# Patient Record
Sex: Female | Born: 1935 | Race: White | Hispanic: No | Smoking: Never smoker
Health system: Southern US, Community
[De-identification: ages and names within clinical notes are randomized; demographics above are authoritative.]

## PROBLEM LIST (undated history)

## (undated) DIAGNOSIS — C50919 Malignant neoplasm of unspecified site of unspecified female breast: Secondary | ICD-10-CM

## (undated) DIAGNOSIS — L570 Actinic keratosis: Secondary | ICD-10-CM

## (undated) DIAGNOSIS — H269 Unspecified cataract: Secondary | ICD-10-CM

## (undated) DIAGNOSIS — C3490 Malignant neoplasm of unspecified part of unspecified bronchus or lung: Secondary | ICD-10-CM

## (undated) HISTORY — DX: Actinic keratosis: L57.0

## (undated) HISTORY — PX: ABDOMINAL HYSTERECTOMY: SHX81

## (undated) HISTORY — DX: Malignant neoplasm of unspecified part of unspecified bronchus or lung: C34.90

## (undated) HISTORY — PX: BREAST LUMPECTOMY: SHX2

## (undated) HISTORY — DX: Malignant neoplasm of unspecified site of unspecified female breast: C50.919

## (undated) HISTORY — PX: PARTIAL HYSTERECTOMY: SHX80

## (undated) HISTORY — DX: Unspecified cataract: H26.9

---

## 1981-04-14 DIAGNOSIS — C3490 Malignant neoplasm of unspecified part of unspecified bronchus or lung: Secondary | ICD-10-CM

## 1981-04-14 HISTORY — DX: Malignant neoplasm of unspecified part of unspecified bronchus or lung: C34.90

## 1995-04-15 DIAGNOSIS — C50919 Malignant neoplasm of unspecified site of unspecified female breast: Secondary | ICD-10-CM

## 1995-04-15 HISTORY — DX: Malignant neoplasm of unspecified site of unspecified female breast: C50.919

## 2004-01-31 ENCOUNTER — Ambulatory Visit: Payer: Self-pay | Admitting: Internal Medicine

## 2004-03-06 ENCOUNTER — Ambulatory Visit: Payer: Self-pay | Admitting: Oncology

## 2004-07-27 ENCOUNTER — Inpatient Hospital Stay: Payer: Self-pay | Admitting: Internal Medicine

## 2004-08-26 ENCOUNTER — Emergency Department: Payer: Self-pay | Admitting: Emergency Medicine

## 2004-09-02 ENCOUNTER — Ambulatory Visit: Payer: Self-pay | Admitting: Internal Medicine

## 2004-10-22 ENCOUNTER — Ambulatory Visit: Payer: Self-pay | Admitting: Internal Medicine

## 2004-12-05 ENCOUNTER — Ambulatory Visit: Payer: Self-pay | Admitting: Internal Medicine

## 2005-02-03 ENCOUNTER — Ambulatory Visit: Payer: Self-pay | Admitting: Internal Medicine

## 2005-02-18 ENCOUNTER — Ambulatory Visit: Payer: Self-pay | Admitting: Internal Medicine

## 2005-04-16 ENCOUNTER — Ambulatory Visit: Payer: Self-pay | Admitting: Internal Medicine

## 2005-06-16 ENCOUNTER — Ambulatory Visit: Payer: Self-pay | Admitting: Internal Medicine

## 2005-12-17 ENCOUNTER — Ambulatory Visit: Payer: Self-pay | Admitting: Internal Medicine

## 2005-12-29 ENCOUNTER — Ambulatory Visit: Payer: Self-pay | Admitting: Internal Medicine

## 2006-05-08 ENCOUNTER — Ambulatory Visit: Payer: Self-pay | Admitting: Internal Medicine

## 2006-05-11 ENCOUNTER — Ambulatory Visit: Payer: Self-pay | Admitting: Internal Medicine

## 2006-07-13 ENCOUNTER — Ambulatory Visit: Payer: Self-pay | Admitting: Internal Medicine

## 2007-02-10 ENCOUNTER — Ambulatory Visit: Payer: Self-pay | Admitting: Internal Medicine

## 2008-02-11 ENCOUNTER — Ambulatory Visit: Payer: Self-pay | Admitting: Internal Medicine

## 2009-02-15 ENCOUNTER — Ambulatory Visit: Payer: Self-pay | Admitting: Internal Medicine

## 2009-10-03 ENCOUNTER — Ambulatory Visit: Payer: Self-pay | Admitting: Internal Medicine

## 2010-04-30 ENCOUNTER — Ambulatory Visit: Payer: Self-pay | Admitting: Internal Medicine

## 2011-06-04 ENCOUNTER — Ambulatory Visit: Payer: Self-pay | Admitting: Internal Medicine

## 2012-06-07 ENCOUNTER — Ambulatory Visit: Payer: Self-pay | Admitting: Internal Medicine

## 2013-06-13 ENCOUNTER — Inpatient Hospital Stay: Payer: Self-pay | Admitting: Internal Medicine

## 2013-06-13 LAB — CBC WITH DIFFERENTIAL/PLATELET
BASOS ABS: 0.1 10*3/uL (ref 0.0–0.1)
Basophil %: 0.5 %
Eosinophil #: 0 10*3/uL (ref 0.0–0.7)
Eosinophil %: 0.2 %
HCT: 42 % (ref 35.0–47.0)
HGB: 15.5 g/dL (ref 12.0–16.0)
LYMPHS PCT: 15.8 %
Lymphocyte #: 2.5 10*3/uL (ref 1.0–3.6)
MCH: 32.4 pg (ref 26.0–34.0)
MCHC: 36.8 g/dL — AB (ref 32.0–36.0)
MCV: 88 fL (ref 80–100)
MONO ABS: 0.9 x10 3/mm (ref 0.2–0.9)
MONOS PCT: 6 %
NEUTROS PCT: 77.5 %
Neutrophil #: 12.2 10*3/uL — ABNORMAL HIGH (ref 1.4–6.5)
Platelet: 115 10*3/uL — ABNORMAL LOW (ref 150–440)
RBC: 4.78 10*6/uL (ref 3.80–5.20)
RDW: 12.9 % (ref 11.5–14.5)
WBC: 15.7 10*3/uL — ABNORMAL HIGH (ref 3.6–11.0)

## 2013-06-13 LAB — COMPREHENSIVE METABOLIC PANEL
ANION GAP: 10 (ref 7–16)
Albumin: 3.6 g/dL (ref 3.4–5.0)
Alkaline Phosphatase: 57 U/L
BUN: 7 mg/dL (ref 7–18)
Bilirubin,Total: 1.1 mg/dL — ABNORMAL HIGH (ref 0.2–1.0)
CALCIUM: 8.7 mg/dL (ref 8.5–10.1)
CO2: 26 mmol/L (ref 21–32)
Chloride: 76 mmol/L — ABNORMAL LOW (ref 98–107)
Creatinine: 0.65 mg/dL (ref 0.60–1.30)
EGFR (African American): >60
Glucose: 143 mg/dL — ABNORMAL HIGH (ref 65–99)
OSMOLALITY: 228 (ref 275–301)
POTASSIUM: 3.3 mmol/L — AB (ref 3.5–5.1)
SGOT(AST): 32 U/L (ref 15–37)
SGPT (ALT): 19 U/L (ref 12–78)
Sodium: 112 mmol/L — CL (ref 136–145)
Total Protein: 7.3 g/dL (ref 6.4–8.2)

## 2013-06-13 LAB — URINALYSIS, COMPLETE
BACTERIA: NONE SEEN
Bilirubin,UR: NEGATIVE
Blood: NEGATIVE
Glucose,UR: NEGATIVE mg/dL (ref 0–75)
Nitrite: NEGATIVE
PH: 7 (ref 4.5–8.0)
PROTEIN: NEGATIVE
Specific Gravity: 1.016 (ref 1.003–1.030)
Squamous Epithelial: 8
WBC UR: 5 /HPF (ref 0–5)

## 2013-06-13 LAB — OSMOLALITY: Osmolality: 240 mOsm/kg — CL (ref 280–301)

## 2013-06-13 LAB — OSMOLALITY, URINE: Osmolality: 529 mOsm/kg

## 2013-06-13 LAB — RAPID INFLUENZA A&B ANTIGENS

## 2013-06-14 LAB — BASIC METABOLIC PANEL
Anion Gap: 11 (ref 7–16)
Anion Gap: 6 — ABNORMAL LOW (ref 7–16)
Anion Gap: 6 — ABNORMAL LOW (ref 7–16)
BUN: 6 mg/dL — AB (ref 7–18)
BUN: 5 mg/dL — ABNORMAL LOW (ref 7–18)
BUN: 5 mg/dL — ABNORMAL LOW (ref 7–18)
CALCIUM: 8 mg/dL — AB (ref 8.5–10.1)
CHLORIDE: 87 mmol/L — AB (ref 98–107)
CHLORIDE: 94 mmol/L — AB (ref 98–107)
CO2: 26 mmol/L (ref 21–32)
CO2: 27 mmol/L (ref 21–32)
CREATININE: 0.68 mg/dL (ref 0.60–1.30)
Calcium, Total: 8.2 mg/dL — ABNORMAL LOW (ref 8.5–10.1)
Calcium, Total: 8.1 mg/dL — ABNORMAL LOW (ref 8.5–10.1)
Chloride: 82 mmol/L — ABNORMAL LOW (ref 98–107)
Co2: 27 mmol/L (ref 21–32)
Creatinine: 0.59 mg/dL — ABNORMAL LOW (ref 0.60–1.30)
Creatinine: 0.65 mg/dL (ref 0.60–1.30)
EGFR (African American): >60
EGFR (African American): >60
EGFR (Non-African Amer.): >60
Glucose: 106 mg/dL — ABNORMAL HIGH (ref 65–99)
Glucose: 97 mg/dL (ref 65–99)
Glucose: 87 mg/dL (ref 65–99)
OSMOLALITY: 239 (ref 275–301)
Osmolality: 238 (ref 275–301)
Osmolality: 252 (ref 275–301)
POTASSIUM: 3.3 mmol/L — AB (ref 3.5–5.1)
Potassium: 3.4 mmol/L — ABNORMAL LOW (ref 3.5–5.1)
Potassium: 3.3 mmol/L — ABNORMAL LOW (ref 3.5–5.1)
SODIUM: 119 mmol/L — AB (ref 136–145)
SODIUM: 120 mmol/L — AB (ref 136–145)
Sodium: 127 mmol/L — ABNORMAL LOW (ref 136–145)

## 2013-06-14 LAB — CBC WITH DIFFERENTIAL/PLATELET
Basophil #: 0 10*3/uL (ref 0.0–0.1)
Basophil %: 0.5 %
Eosinophil #: 0.1 10*3/uL (ref 0.0–0.7)
Eosinophil %: 0.5 %
HCT: 38.3 % (ref 35.0–47.0)
HGB: 14.1 g/dL (ref 12.0–16.0)
LYMPHS PCT: 25.2 %
Lymphocyte #: 2.5 10*3/uL (ref 1.0–3.6)
MCH: 33.1 pg (ref 26.0–34.0)
MCHC: 36.9 g/dL — AB (ref 32.0–36.0)
MCV: 90 fL (ref 80–100)
Monocyte #: 1 x10 3/mm — ABNORMAL HIGH (ref 0.2–0.9)
Monocyte %: 10 %
NEUTROS PCT: 63.8 %
Neutrophil #: 6.3 10*3/uL (ref 1.4–6.5)
Platelet: 158 10*3/uL (ref 150–440)
RBC: 4.28 10*6/uL (ref 3.80–5.20)
RDW: 12.8 % (ref 11.5–14.5)
WBC: 9.9 10*3/uL (ref 3.6–11.0)

## 2013-06-14 LAB — TSH: Thyroid Stimulating Horm: 17.3 u[IU]/mL — ABNORMAL HIGH

## 2013-06-15 LAB — BASIC METABOLIC PANEL
Anion Gap: 5 — ABNORMAL LOW (ref 7–16)
BUN: 7 mg/dL (ref 7–18)
CHLORIDE: 103 mmol/L (ref 98–107)
Calcium, Total: 8.2 mg/dL — ABNORMAL LOW (ref 8.5–10.1)
Co2: 28 mmol/L (ref 21–32)
Creatinine: 0.69 mg/dL (ref 0.60–1.30)
EGFR (African American): >60
EGFR (Non-African Amer.): >60
GLUCOSE: 89 mg/dL (ref 65–99)
OSMOLALITY: 269 (ref 275–301)
Potassium: 3.2 mmol/L — ABNORMAL LOW (ref 3.5–5.1)
Sodium: 136 mmol/L (ref 136–145)

## 2013-06-15 LAB — CBC WITH DIFFERENTIAL/PLATELET
BASOS ABS: 0.1 10*3/uL (ref 0.0–0.1)
BASOS PCT: 0.7 %
EOS PCT: 1.4 %
Eosinophil #: 0.1 10*3/uL (ref 0.0–0.7)
HCT: 37.5 % (ref 35.0–47.0)
HGB: 13.6 g/dL (ref 12.0–16.0)
LYMPHS ABS: 2.6 10*3/uL (ref 1.0–3.6)
Lymphocyte %: 28.4 %
MCH: 32.8 pg (ref 26.0–34.0)
MCHC: 36.1 g/dL — AB (ref 32.0–36.0)
MCV: 91 fL (ref 80–100)
MONO ABS: 0.9 x10 3/mm (ref 0.2–0.9)
Monocyte %: 9.2 %
NEUTROS PCT: 60.3 %
Neutrophil #: 5.6 10*3/uL (ref 1.4–6.5)
PLATELETS: 166 10*3/uL (ref 150–440)
RBC: 4.14 10*6/uL (ref 3.80–5.20)
RDW: 13.4 % (ref 11.5–14.5)
WBC: 9.2 10*3/uL (ref 3.6–11.0)

## 2013-06-15 LAB — URINE CULTURE

## 2013-06-17 LAB — BASIC METABOLIC PANEL
ANION GAP: 7 (ref 7–16)
Anion Gap: 6 — ABNORMAL LOW (ref 7–16)
Anion Gap: 6 — ABNORMAL LOW (ref 7–16)
Anion Gap: 6 — ABNORMAL LOW (ref 7–16)
BUN: 7 mg/dL (ref 7–18)
BUN: 6 mg/dL — ABNORMAL LOW (ref 7–18)
BUN: 8 mg/dL (ref 7–18)
BUN: 10 mg/dL (ref 7–18)
CO2: 30 mmol/L (ref 21–32)
CO2: 29 mmol/L (ref 21–32)
CREATININE: 0.66 mg/dL (ref 0.60–1.30)
CREATININE: 0.73 mg/dL (ref 0.60–1.30)
Calcium, Total: 8.5 mg/dL (ref 8.5–10.1)
Calcium, Total: 9.1 mg/dL (ref 8.5–10.1)
Calcium, Total: 9.1 mg/dL (ref 8.5–10.1)
Calcium, Total: 9.3 mg/dL (ref 8.5–10.1)
Chloride: 86 mmol/L — ABNORMAL LOW (ref 98–107)
Chloride: 88 mmol/L — ABNORMAL LOW (ref 98–107)
Chloride: 89 mmol/L — ABNORMAL LOW (ref 98–107)
Chloride: 90 mmol/L — ABNORMAL LOW (ref 98–107)
Co2: 30 mmol/L (ref 21–32)
Co2: 29 mmol/L (ref 21–32)
Creatinine: 0.74 mg/dL (ref 0.60–1.30)
Creatinine: 0.81 mg/dL (ref 0.60–1.30)
EGFR (African American): >60
EGFR (African American): >60
EGFR (African American): >60
EGFR (African American): >60
EGFR (Non-African Amer.): >60
EGFR (Non-African Amer.): >60
GLUCOSE: 118 mg/dL — AB (ref 65–99)
Glucose: 103 mg/dL — ABNORMAL HIGH (ref 65–99)
Glucose: 127 mg/dL — ABNORMAL HIGH (ref 65–99)
Glucose: 105 mg/dL — ABNORMAL HIGH (ref 65–99)
Osmolality: 244 (ref 275–301)
Osmolality: 249 (ref 275–301)
Osmolality: 251 (ref 275–301)
Osmolality: 251 (ref 275–301)
POTASSIUM: 3.3 mmol/L — AB (ref 3.5–5.1)
POTASSIUM: 3.3 mmol/L — AB (ref 3.5–5.1)
POTASSIUM: 3.4 mmol/L — AB (ref 3.5–5.1)
Potassium: 3.5 mmol/L (ref 3.5–5.1)
SODIUM: 125 mmol/L — AB (ref 136–145)
Sodium: 122 mmol/L — ABNORMAL LOW (ref 136–145)
Sodium: 124 mmol/L — ABNORMAL LOW (ref 136–145)
Sodium: 125 mmol/L — ABNORMAL LOW (ref 136–145)

## 2013-06-17 LAB — CBC WITH DIFFERENTIAL/PLATELET
Basophil #: 0.1 10*3/uL (ref 0.0–0.1)
Basophil %: 0.7 %
Eosinophil #: 0.2 10*3/uL (ref 0.0–0.7)
Eosinophil %: 1.8 %
HCT: 38.8 % (ref 35.0–47.0)
HGB: 13.6 g/dL (ref 12.0–16.0)
LYMPHS ABS: 2.9 10*3/uL (ref 1.0–3.6)
Lymphocyte %: 21.6 %
MCH: 31.4 pg (ref 26.0–34.0)
MCHC: 35.1 g/dL (ref 32.0–36.0)
MCV: 89 fL (ref 80–100)
MONO ABS: 1 x10 3/mm — AB (ref 0.2–0.9)
Monocyte %: 7.3 %
NEUTROS PCT: 68.6 %
Neutrophil #: 9.3 10*3/uL — ABNORMAL HIGH (ref 1.4–6.5)
Platelet: 169 10*3/uL (ref 150–440)
RBC: 4.34 10*6/uL (ref 3.80–5.20)
RDW: 13 % (ref 11.5–14.5)
WBC: 13.6 10*3/uL — ABNORMAL HIGH (ref 3.6–11.0)

## 2013-06-18 LAB — BASIC METABOLIC PANEL
ANION GAP: 5 — AB (ref 7–16)
BUN: 9 mg/dL (ref 7–18)
CALCIUM: 9.1 mg/dL (ref 8.5–10.1)
CHLORIDE: 95 mmol/L — AB (ref 98–107)
CO2: 31 mmol/L (ref 21–32)
Creatinine: 0.8 mg/dL (ref 0.60–1.30)
EGFR (African American): >60
EGFR (Non-African Amer.): >60
Glucose: 89 mg/dL (ref 65–99)
Osmolality: 261 (ref 275–301)
Potassium: 3.5 mmol/L (ref 3.5–5.1)
SODIUM: 131 mmol/L — AB (ref 136–145)

## 2013-06-18 LAB — URINALYSIS, COMPLETE
BACTERIA: NONE SEEN
BILIRUBIN, UR: NEGATIVE
Blood: NEGATIVE
Glucose,UR: NEGATIVE mg/dL (ref 0–75)
Ketone: NEGATIVE
Nitrite: NEGATIVE
Ph: 7 (ref 4.5–8.0)
Protein: NEGATIVE
RBC,UR: 3 /HPF (ref 0–5)
Specific Gravity: 1.002 (ref 1.003–1.030)
Squamous Epithelial: 8
WBC UR: 5 /HPF (ref 0–5)

## 2013-06-19 LAB — CBC WITH DIFFERENTIAL/PLATELET
Basophil #: 0.1 10*3/uL (ref 0.0–0.1)
Basophil %: 1.3 %
Eosinophil #: 0.3 10*3/uL (ref 0.0–0.7)
Eosinophil %: 3.1 %
HCT: 41.7 % (ref 35.0–47.0)
HGB: 14.5 g/dL (ref 12.0–16.0)
Lymphocyte #: 2.8 10*3/uL (ref 1.0–3.6)
Lymphocyte %: 29 %
MCH: 32.1 pg (ref 26.0–34.0)
MCHC: 34.7 g/dL (ref 32.0–36.0)
MCV: 93 fL (ref 80–100)
MONO ABS: 1 x10 3/mm — AB (ref 0.2–0.9)
MONOS PCT: 10.2 %
Neutrophil #: 5.5 10*3/uL (ref 1.4–6.5)
Neutrophil %: 56.4 %
PLATELETS: 195 10*3/uL (ref 150–440)
RBC: 4.51 10*6/uL (ref 3.80–5.20)
RDW: 13.4 % (ref 11.5–14.5)
WBC: 9.7 10*3/uL (ref 3.6–11.0)

## 2013-06-19 LAB — BASIC METABOLIC PANEL
Anion Gap: 4 — ABNORMAL LOW (ref 7–16)
BUN: 13 mg/dL (ref 7–18)
CHLORIDE: 99 mmol/L (ref 98–107)
Calcium, Total: 8.6 mg/dL (ref 8.5–10.1)
Co2: 31 mmol/L (ref 21–32)
Creatinine: 0.78 mg/dL (ref 0.60–1.30)
EGFR (Non-African Amer.): >60
GLUCOSE: 89 mg/dL (ref 65–99)
OSMOLALITY: 268 (ref 275–301)
Potassium: 3.4 mmol/L — ABNORMAL LOW (ref 3.5–5.1)
SODIUM: 134 mmol/L — AB (ref 136–145)

## 2013-06-20 LAB — BASIC METABOLIC PANEL
ANION GAP: 4 — AB (ref 7–16)
BUN: 10 mg/dL (ref 7–18)
CREATININE: 0.8 mg/dL (ref 0.60–1.30)
Calcium, Total: 8.7 mg/dL (ref 8.5–10.1)
Chloride: 101 mmol/L (ref 98–107)
Co2: 30 mmol/L (ref 21–32)
Glucose: 89 mg/dL (ref 65–99)
Osmolality: 269 (ref 275–301)
Potassium: 4 mmol/L (ref 3.5–5.1)
SODIUM: 135 mmol/L — AB (ref 136–145)

## 2013-06-22 ENCOUNTER — Encounter: Payer: Self-pay | Admitting: Internal Medicine

## 2013-06-23 LAB — BASIC METABOLIC PANEL
Anion Gap: 4 — ABNORMAL LOW (ref 7–16)
BUN: 15 mg/dL (ref 7–18)
CREATININE: 0.92 mg/dL (ref 0.60–1.30)
Calcium, Total: 9.3 mg/dL (ref 8.5–10.1)
Chloride: 101 mmol/L (ref 98–107)
Co2: 30 mmol/L (ref 21–32)
EGFR (African American): >60
EGFR (Non-African Amer.): 56 — ABNORMAL LOW
GLUCOSE: 87 mg/dL (ref 65–99)
Osmolality: 270 (ref 275–301)
POTASSIUM: 4 mmol/L (ref 3.5–5.1)
Sodium: 135 mmol/L — ABNORMAL LOW (ref 136–145)

## 2013-07-01 LAB — URINALYSIS, COMPLETE
BLOOD: NEGATIVE
Bilirubin,UR: NEGATIVE
Glucose,UR: NEGATIVE mg/dL (ref 0–75)
Ketone: NEGATIVE
NITRITE: NEGATIVE
PH: 6 (ref 4.5–8.0)
PROTEIN: NEGATIVE
Specific Gravity: 1.01 (ref 1.003–1.030)
Squamous Epithelial: 2

## 2013-07-02 LAB — URINE CULTURE

## 2013-08-01 ENCOUNTER — Ambulatory Visit: Payer: Self-pay | Admitting: Internal Medicine

## 2013-10-31 DIAGNOSIS — D696 Thrombocytopenia, unspecified: Secondary | ICD-10-CM | POA: Insufficient documentation

## 2013-10-31 DIAGNOSIS — E785 Hyperlipidemia, unspecified: Secondary | ICD-10-CM | POA: Insufficient documentation

## 2013-10-31 DIAGNOSIS — I10 Essential (primary) hypertension: Secondary | ICD-10-CM | POA: Insufficient documentation

## 2013-10-31 DIAGNOSIS — E079 Disorder of thyroid, unspecified: Secondary | ICD-10-CM | POA: Insufficient documentation

## 2013-11-01 ENCOUNTER — Ambulatory Visit: Payer: Self-pay

## 2014-08-04 ENCOUNTER — Ambulatory Visit
Admit: 2014-08-04 | Disposition: A | Discharge status: Home / Self Care | Payer: Self-pay | Attending: Internal Medicine | Admitting: Internal Medicine

## 2014-08-05 NOTE — Discharge Summary (Signed)
PATIENT NAME:  Savannah [name], Savannah [name] MR#:  [id number] DATE OF BIRTH:  May 31, 1922  DATE OF ADMISSION:  06/13/2013 DATE OF DISCHARGE: 06/21/2013   HISTORY OF PRESENT ILLNESS: Savannah [name] is an 81 year old white lady, who has suffered from an upper respiratory tract infection as an outpatient and was under treatment when she developed nausea, vomiting, and diarrhea. She presented to the Emergency Room with severe weakness and was found to have a sodium of 112. She was therefore admitted for further evaluation.   PAST MEDICAL HISTORY: Notable for hypertension, hyperlipidemia, a history of breast cancer and a history of a right middle lobe resection for lung cancer.   PAST SURGICAL HISTORY: Included a previous hysterectomy.   ALLERGIES: CIPRO AND SULFA.   MEDICATIONS ON ADMISSION: Included aspirin 81 mg daily, atenolol 50 mg daily, multivitamin with minerals 1 daily, simvastatin 20 mg at bedtime, Synthroid 112 mcg daily and vitamin D3 1 tablet daily.   ADMISSION PHYSICAL EXAMINATION: Revealed a temperature 98, pulse 68, respirations 20, and blood pressure 140/69. Pulse oximetry was 97%. Admitting examination as described by the admitting physician was notable for dry mucous membranes. The patient looked frail, but the exam was otherwise unremarkable.   LABORATORY DATA: Admission CBC showed a hemoglobin of 15.5 with a hematocrit of 42. White count was 15,700. Platelet count was 115,000. Admission comprehensive metabolic panel showed a random blood sugar of 143. BUN was 7 with a creatinine of 0.65. Sodium was 112. Potassium was 3.3. Chloride was 76. The remainder of the panel was unremarkable. Blood osmolality was low at 240. Urine osmolality was 529. Urinalysis showed 1+ ketones and 2+ leukocyte esterase. Subsequent urine culture showed mixed bacterial organisms. Screening for influenza A and B was negative. EKG showed a normal sinus rhythm with left anterior hemi-block. Chest x-Wisman showed no active disease.    HOSPITAL COURSE: The patient was admitted to the regular medical floor where her preadmission medications were discontinued. Although was not mentioned in the admission note, she had been on lisinopril/hydrochlorothiazide as an outpatient. The patient was given normal saline. She was also given a salt substitute. With these measures, the patient's sodium actually came back up to 136. She was placed back on her blood pressure medications, including the lisinopril/hydrochlorothiazide because of blood pressure spikes. Unfortunately when the hydrochlorothiazide was added back, she dropped her sodium again. Hydrochlorothiazide was discontinued and she was placed on Norvasc in its place. Her hospital course was characterized by a blood pressure that was somewhat hard to control when she ambulated. It was fairly normal at rest. On one occasion, she did have an episode of nocturnal confusion. CT scan of the head was unremarkable. Carotid Doppler showed less than 50% stenosis bilaterally. It was felt to be clinically insignificant. By the time of transfer, her confusion; however, had cleared. The patient's final chemistry panel done on March 9 was notable only for a sodium of 135. Her final CBC done on March 8 showed a hemoglobin of 14.5 with a hematocrit of 41.7. White count was 9700. Platelet count was 195,000.   DISCHARGE DIAGNOSES:  1.  Severe hyponatremia secondary to volume depletion.  2.  Essential hypertension.  3.  Hyperlipidemia.  4.  History of lung cancer.  5.  History of breast cancer.   DISCHARGE MEDICATIONS:  1.  Zocor 20 mg at bedtime.  2.  Lovaza 1 gram daily.  3.  Synthroid 0.125 mg daily.  4.  Multivitamin with minerals tablet 1 daily.  5.  Aspirin 81  mg daily.  6.  Tylenol 650 mg every 4 hours as needed for pain or fever.  7.  Robitussin 15 mL every 6 hours p.r.n. cough.  8.  Maalox 15 mL q.6 hours p.r.n. indigestion.  9.  Zantac 150 mg b.i.d.  10. Atenolol 50 mg b.i.d.  11.  Losartan 50 mg daily.  12. Nitroglycerin 2% ointment 1 inch b.i.d.  13. Amlodipine 5 mg daily.  14. Colace 100 mg b.i.d.  15. MiraLax powder 1 gram daily p.r.n. constipation.  16. Preparation H ointment b.i.d. p.r.n.   DISCHARGE DISPOSITION: The patient is being discharged on a regular diet. Her sodium restriction was stopped on admission. She is being transferred to a rehab facility for further reconditioning prior to returning home. The patient at this point is a full code.  ____________________________ Hewitt Blade. Sarina Ser, MD jbw:aw D: 06/21/2013 12:14:22 ET T: 06/21/2013 12:30:12 ET JOB#: [id number]  cc: Hewitt Blade. Sarina Ser, MD, <Dictator> Lottie Mussel III MD ELECTRONICALLY SIGNED 06/23/2013 8:09

## 2014-08-05 NOTE — H&P (Signed)
PATIENT NAME:  Savannah [name], Savannah [name] MR#:  [id number] DATE OF BIRTH:  Jul 10, 1922  DATE OF ADMISSION:  06/13/2013  PRIMARY CARE PROVIDER: Dr. Gilford Rile.   EMERGENCY DEPARTMENT REFERRING PHYSICIAN: Dr. Corky Downs.   CHIEF COMPLAINT: Generalized weakness, nausea, vomiting, coughing for the past 3 weeks.   HISTORY OF PRESENT ILLNESS: The patient is an 81 year old white female with history of lung cancer, status post middle lobe resection, who has not been feeling well since January. Initially started off with a urinary tract infection, was treated with antibiotics, and then she subsequently started having cough, which is productive and yellow in nature. Therefore, she was seen by Dr. Gilford Rile. He changed the antibiotics. He also treated her with prednisone taper. The patient still has been having significant cough. Over the past few days, she has had nausea and vomiting, but no diarrhea. She did have diarrhea 3 weeks ago; that has resolved. She came to the ED and was noted to have a sodium that was very low of 112 and a potassium of 3.3. Therefore, we were asked to admit the patient. The patient has not had any issues with confusion, seizures. Has had difficulty with walking and has no appetite. She has not had any fevers or chills or chest pains. She did have an episode of throwing up blood last week, according to the family, but none since.   PAST MEDICAL HISTORY:  1.  History of breast cancer.  2.  History of right lung cancer, status post middle lobe resection.  3.  Hyperlipidemia.  4.  Hypertension.    PAST SURGICAL HISTORY: Status post hysterectomy.   ALLERGIES: CIPRO AND SULFA.   SOCIAL HISTORY: Does not smoke. Does not drink. No drugs. Lives by herself.   FAMILY HISTORY: Positive for hypertension.   CURRENT MEDICATIONS: Aspirin 81, 1 tab p.o. daily; atenolol 50, 1 tab p.o. daily; cod liver oil 1 tab p.o. daily, fish oil one once a day. Levaquin, she has 2 more days to finish the course.  Multivitamin with  minerals, simvastatin 20 at bedtime, Synthroid 125 mcg daily, vitamin D3 daily.     REVIEW OF SYSTEMS:   CONSTITUTIONAL: Denies any fevers. Complains of fatigue, weakness. No pain. No weight loss. No weight gain.  EYES: No blurred or double vision. No pain. No redness. No inflammation. Has cataracts and was planned to have surgery tomorrow.  ENT: No tinnitus. No ear pain. No allergies, seasonal or year-round allergies. No epistaxis. No nasal discharge. No difficulty swallowing.  RESPIRATORY: Complains of cough, but no wheezing. No hemoptysis. Has a history of lung cancer.  CARDIOVASCULAR: Denies any chest pain, orthopnea, edema or arrhythmia.  GASTROINTESTINAL: Complains of nausea, vomiting, but no diarrhea. No abdominal pain. No hematemesis. No melena.  GENITOURINARY: Denies any dysuria, hematuria, renal calc or frequency.  ENDOCRINE: Denies any polyuria, nocturia or thyroid problems.  HEMATOLOGIC AND LYMPHATICS:  Denies anemia, easy bruisability or bleeding.  SKIN: No acne. No rash. No changes in mole, hair or skin.  MUSCULOSKELETAL: Denies any pain in the neck, back or shoulder.  NEUROLOGIC: No numbness, CVA, TIA.  PSYCHIATRIC: No anxiety, insomnia or ADD.   PHYSICAL EXAMINATION: VITAL SIGNS: Temperature 98, pulse 68, respirations 20. Blood pressure was 140/69, O2 of 97%.  GENERAL: The patient is a frail Caucasian female in no acute distress.  HEENT: Head atraumatic, normocephalic. Pupils equally round, reactive to light and accommodation. There is no conjunctival pallor. Oropharynx:  Mouth looks a little dry. There is no erythema.  NECK: Supple  without any JVD.  CARDIOVASCULAR: Regular rate and rhythm. No murmurs, rubs, clicks or gallops.  LUNGS: Clear to auscultation bilaterally without any rales, rhonchi, wheezing.  ABDOMEN: Soft, nontender, nondistended. Positive bowel sounds x 4.  EXTREMITIES: No clubbing, cyanosis or edema.  SKIN: Very dry.  LYMPHATICS: No lymph nodes palpable.   VASCULAR: Good DP, PT pulses.  PSYCHIATRIC: Not anxious or depressed.  NEUROLOGIC: Awake, alert, oriented x 3. No focal deficits.   LABORATORY DATA: Glucose 143, BUN 7, creatinine 0.65, sodium 112, potassium 3.3, chloride 76. CO2 is 26. LFTs are normal. WBC 15.7, hemoglobin 15.5. Platelet count is 115. Urinalysis shows leukocytes 2+. Bacteria: None seen. Nitrites negative. Chest x-Ziesmer shows no active cardiopulmonary processes.   ASSESSMENT AND PLAN: The patient is an 81 year old white female presenting with generalized weakness, noted to have severe hyponatremia.  1.  Severe hyponatremia, likely due to dehydration. At this time, we will give her IV fluids, check serum and urine osmos. We will keep a close eye on her sodium, not rapidly corrected.  2.  Hypertension. We will hold atenolol.  3. Cough. Postinfectious cough due to bronchitis. I will place her on some Mucinex and p.r.n. cough medication.  4.  Hyperlipidemia. Continue simvastatin.  5.  Hypothyroidism. We will continue Synthroid. Check a TSH.  6.  Miscellaneous: We will do Lovenox for DVT prophylaxis.     NOTE: Time spent: 50 minutes on this patient.    ____________________________ Lafonda Mosses. Posey Pronto, MD shp:dmm D: 06/13/2013 17:14:05 ET T: 06/13/2013 20:47:26 ET JOB#: [id number]  cc: Akito Boomhower H. Posey Pronto, MD, <Dictator> Alric Seton MD ELECTRONICALLY SIGNED 06/21/2013 15:34

## 2014-11-22 ENCOUNTER — Encounter: Payer: Self-pay | Admitting: Oncology

## 2014-11-22 ENCOUNTER — Encounter (INDEPENDENT_AMBULATORY_CARE_PROVIDER_SITE_OTHER): Payer: Self-pay

## 2014-11-22 ENCOUNTER — Inpatient Hospital Stay: Payer: Medicare Other

## 2014-11-22 ENCOUNTER — Inpatient Hospital Stay: Payer: Medicare Other | Attending: Oncology | Admitting: Oncology

## 2014-11-22 VITALS — BP 162/93 | HR 60 | Temp 98.5°F | Resp 18 | Wt 176.1 lb

## 2014-11-22 DIAGNOSIS — Z7982 Long term (current) use of aspirin: Secondary | ICD-10-CM | POA: Diagnosis not present

## 2014-11-22 DIAGNOSIS — Z90710 Acquired absence of both cervix and uterus: Secondary | ICD-10-CM

## 2014-11-22 DIAGNOSIS — Z85118 Personal history of other malignant neoplasm of bronchus and lung: Secondary | ICD-10-CM

## 2014-11-22 DIAGNOSIS — D696 Thrombocytopenia, unspecified: Secondary | ICD-10-CM

## 2014-11-22 DIAGNOSIS — Z853 Personal history of malignant neoplasm of breast: Secondary | ICD-10-CM

## 2014-11-22 DIAGNOSIS — Z79899 Other long term (current) drug therapy: Secondary | ICD-10-CM

## 2014-11-22 LAB — CBC
HCT: 42 % (ref 35.0–47.0)
Hemoglobin: 14.2 g/dL (ref 12.0–16.0)
MCH: 31.2 pg (ref 26.0–34.0)
MCHC: 34 g/dL (ref 32.0–36.0)
MCV: 91.9 fL (ref 80.0–100.0)
PLATELETS: 130 10*3/uL — AB (ref 150–440)
RBC: 4.56 MIL/uL (ref 3.80–5.20)
RDW: 13.5 % (ref 11.5–14.5)
WBC: 10 10*3/uL (ref 3.6–11.0)

## 2014-11-22 LAB — IRON AND TIBC
Iron: 78 ug/dL (ref 28–170)
SATURATION RATIOS: 25 % (ref 10.4–31.8)
TIBC: 312 ug/dL (ref 250–450)
UIBC: 234 ug/dL

## 2014-11-22 LAB — FOLATE: Folate: 33 ng/mL (ref >5.9)

## 2014-11-22 LAB — VITAMIN B12: VITAMIN B 12: 398 pg/mL (ref 180–914)

## 2014-11-22 LAB — FERRITIN: Ferritin: 36 ng/mL (ref 11–307)

## 2014-11-23 LAB — PROTEIN ELECTROPHORESIS, SERUM
A/G RATIO SPE: 1.4 (ref 0.7–1.7)
ALBUMIN ELP: 3.7 g/dL (ref 2.9–4.4)
ALPHA-1-GLOBULIN: 0.2 g/dL (ref 0.0–0.4)
ALPHA-2-GLOBULIN: 0.8 g/dL (ref 0.4–1.0)
Beta Globulin: 0.9 g/dL (ref 0.7–1.3)
GLOBULIN, TOTAL: 2.7 g/dL (ref 2.2–3.9)
Gamma Globulin: 0.8 g/dL (ref 0.4–1.8)
TOTAL PROTEIN ELP: 6.4 g/dL (ref 6.0–8.5)

## 2014-11-23 LAB — PLATELET ANTIBODY PROFILE, SERUM
HLA AB SER QL EIA: POSITIVE — AB
IA/IIA Antibody: NEGATIVE
IB/IX ANTIBODY: NEGATIVE
IIB/IIIA Antibody: NEGATIVE

## 2014-11-23 LAB — HAPTOGLOBIN: Haptoglobin: 124 mg/dL (ref 34–200)

## 2014-11-27 ENCOUNTER — Encounter: Payer: Self-pay | Admitting: *Deleted

## 2014-11-28 NOTE — Progress Notes (Signed)
[facility]  Telephone:(336) [phone] Fax:(336) [phone]  ID: Ivor Reining OB: June 22, 1922  MR#: [id number]  JFH#:[id number]  Patient Care Team: Madelyn Brunner, MD as PCP - General (Internal Medicine)  CHIEF COMPLAINT:  Chief Complaint  Patient presents with  . New Evaluation    thrombocytopenia    INTERVAL HISTORY: Patient is an 81 year old female who was found to have a slowly declining platelet count over the past year. In July 2015 her platelet count was 125 and more recently on November 01, 2014 her platelet count dropped to 54. She currently feels well and is asymptomatic. She denies any easy bleeding or bruising. She has no neurologic complaints. She has a good appetite and denies weight loss. She denies any chest pain or shortness of breath. She has no nausea, vomiting, constipation, or diarrhea. She has no urinary complaints. Patient feels at her baseline and offers no specific complaints today.   REVIEW OF SYSTEMS:   Review of Systems  Constitutional: Negative.   Respiratory: Negative.   Cardiovascular: Negative.   Gastrointestinal: Negative.   Endo/Heme/Allergies: Does not bruise/bleed easily.    As per HPI. Otherwise, a complete review of systems is negatve.  PAST MEDICAL HISTORY: Past Medical History  Diagnosis Date  . Breast cancer 1997  . Lung cancer 1983  . Cataract     PAST SURGICAL HISTORY: Past Surgical History  Procedure Laterality Date  . Breast lumpectomy    . Partial hysterectomy      FAMILY HISTORY: Reviewed and unchanged. No reported history of malignancy or chronic disease.     ADVANCED DIRECTIVES:    HEALTH MAINTENANCE: Social History  Substance Use Topics  . Smoking status: Never Smoker   . Smokeless tobacco: Never Used  . Alcohol Use: No     Colonoscopy:  PAP:  Bone density:  Lipid panel:  No Known Allergies  Current Outpatient Prescriptions  Medication Sig Dispense Refill  . amLODipine (NORVASC) 5 MG  tablet     . aspirin EC 81 MG tablet Take by mouth.    Marland Kitchen atenolol (TENORMIN) 50 MG tablet TAKE ONE TABLET TWICE A DAY    . ketoconazole (NIZORAL) 2 % cream     . levothyroxine (SYNTHROID, LEVOTHROID) 88 MCG tablet     . ranitidine (ZANTAC) 150 MG tablet     . simvastatin (ZOCOR) 20 MG tablet TAKE ONE TABLET AT BEDTIME    . triamcinolone ointment (KENALOG) 0.1 %     . losartan (COZAAR) 50 MG tablet Take 1 tablet (50 mg total) by mouth daily.    . Omega-3 Fatty Acids (FISH OIL) 1000 MG CPDR Take by mouth.     No current facility-administered medications for this visit.    OBJECTIVE: Filed Vitals:   11/22/14 1625  BP: 162/93  Pulse: 60  Temp: 98.5 F (36.9 C)  Resp: 18     There is no height on file to calculate BMI.    ECOG FS:0 - Asymptomatic  General: Well-developed, well-nourished, no acute distress. Eyes: Pink conjunctiva, anicteric sclera. HEENT: Normocephalic, moist mucous membranes, clear oropharnyx. Lungs: Clear to auscultation bilaterally. Heart: Regular rate and rhythm. No rubs, murmurs, or gallops. Abdomen: Soft, nontender, nondistended. No organomegaly noted, normoactive bowel sounds. Musculoskeletal: No edema, cyanosis, or clubbing. Neuro: Alert, answering all questions appropriately. Cranial nerves grossly intact. Skin: No rashes or petechiae noted. Psych: Normal affect. Lymphatics: No cervical, calvicular, axillary or inguinal LAD.   LAB RESULTS:  Lab Results  Component Value Date  NA 135* 06/23/2013   K 4.0 06/23/2013   CL 101 06/23/2013   CO2 30 06/23/2013   GLUCOSE 87 06/23/2013   BUN 15 06/23/2013   CREATININE 0.92 06/23/2013   CALCIUM 9.3 06/23/2013   PROT 7.3 06/13/2013   ALBUMIN 3.6 06/13/2013   AST 32 06/13/2013   ALT 19 06/13/2013   ALKPHOS 57 06/13/2013   BILITOT 1.1* 06/13/2013   GFRNONAA 56* 06/23/2013   GFRAA >60 06/23/2013    Lab Results  Component Value Date   WBC 10.0 11/22/2014   NEUTROABS 5.5 06/19/2013   HGB 14.2  11/22/2014   HCT 42.0 11/22/2014   MCV 91.9 11/22/2014   PLT 130* 11/22/2014     STUDIES: No results found.  ASSESSMENT: Thrombocytopenia.  PLAN:    1. Thrombocytopenia: Patient was found to have a platelet antibody, but the clinical significance of this is unclear. The remainder of her laboratory work was either negative or within normal limits. Will get a splenic ultrasound in the next week for further evaluation.  Her platelet count has improved to near normal and currently is 130. No intervention is needed at this time. Patient does not require a bone marrow biopsy. Return to clinic in 3 weeks with repeat laboratory work and further evaluation.  Patient expressed understanding and was in agreement with this plan. She also understands that She can call clinic at any time with any questions, concerns, or complaints.    Lloyd Huger, MD   11/28/2014 8:53 AM

## 2014-11-29 ENCOUNTER — Ambulatory Visit
Admission: RE | Admit: 2014-11-29 | Discharge: 2014-11-29 | Disposition: A | Discharge status: Home / Self Care | Payer: Medicare Other | Source: Ambulatory Visit | Attending: Oncology | Admitting: Oncology

## 2014-11-29 DIAGNOSIS — D696 Thrombocytopenia, unspecified: Secondary | ICD-10-CM | POA: Diagnosis present

## 2014-12-13 ENCOUNTER — Other Ambulatory Visit: Payer: Medicare Other

## 2014-12-13 ENCOUNTER — Ambulatory Visit: Payer: Medicare Other | Admitting: Oncology

## 2014-12-13 ENCOUNTER — Inpatient Hospital Stay (HOSPITAL_BASED_OUTPATIENT_CLINIC_OR_DEPARTMENT_OTHER): Payer: Medicare Other | Admitting: Oncology

## 2014-12-13 ENCOUNTER — Inpatient Hospital Stay: Payer: Medicare Other

## 2014-12-13 VITALS — BP 119/73 | HR 54 | Temp 97.9°F | Resp 16 | Wt 161.2 lb

## 2014-12-13 DIAGNOSIS — D696 Thrombocytopenia, unspecified: Secondary | ICD-10-CM

## 2014-12-13 DIAGNOSIS — Z853 Personal history of malignant neoplasm of breast: Secondary | ICD-10-CM | POA: Diagnosis not present

## 2014-12-13 DIAGNOSIS — Z79899 Other long term (current) drug therapy: Secondary | ICD-10-CM

## 2014-12-13 DIAGNOSIS — Z90710 Acquired absence of both cervix and uterus: Secondary | ICD-10-CM

## 2014-12-13 DIAGNOSIS — Z7982 Long term (current) use of aspirin: Secondary | ICD-10-CM

## 2014-12-13 DIAGNOSIS — Z85118 Personal history of other malignant neoplasm of bronchus and lung: Secondary | ICD-10-CM

## 2014-12-13 LAB — CBC WITH DIFFERENTIAL/PLATELET
Basophils Absolute: 0.1 10*3/uL (ref 0–0.1)
Basophils Relative: 1 %
Eosinophils Absolute: 0.1 10*3/uL (ref 0–0.7)
Eosinophils Relative: 2 %
HEMATOCRIT: 41.5 % (ref 35.0–47.0)
Hemoglobin: 14.2 g/dL (ref 12.0–16.0)
LYMPHS ABS: 2.6 10*3/uL (ref 1.0–3.6)
LYMPHS PCT: 35 %
MCH: 31.3 pg (ref 26.0–34.0)
MCHC: 34.2 g/dL (ref 32.0–36.0)
MCV: 91.7 fL (ref 80.0–100.0)
MONO ABS: 0.8 10*3/uL (ref 0.2–0.9)
Monocytes Relative: 10 %
NEUTROS ABS: 3.9 10*3/uL (ref 1.4–6.5)
Neutrophils Relative %: 52 %
Platelets: 169 10*3/uL (ref 150–440)
RBC: 4.53 MIL/uL (ref 3.80–5.20)
RDW: 13.6 % (ref 11.5–14.5)
WBC: 7.5 10*3/uL (ref 3.6–11.0)

## 2014-12-17 NOTE — Progress Notes (Signed)
[facility]  Telephone:(336) [phone] Fax:(336) [phone]  ID: Savannah [name] OB: 1922/12/24  MR#: [id number]  CLE#:[id number]  Patient Care Team: Madelyn Brunner, MD as PCP - General (Internal Medicine)  CHIEF COMPLAINT:  Chief Complaint  Patient presents with  . Follow-up    thrombocytopenia    INTERVAL HISTORY: Patient returns to clinic today for repeat laboratory work and further evaluation. She continues to feel well and is asymptomatic. She denies any easy bleeding or bruising. She has no neurologic complaints. She has a good appetite and denies weight loss. She denies any chest pain or shortness of breath. She has no nausea, vomiting, constipation, or diarrhea. She has no urinary complaints. Patient offers no specific complaints today.   REVIEW OF SYSTEMS:   Review of Systems  Constitutional: Negative.   Respiratory: Negative.   Cardiovascular: Negative.   Gastrointestinal: Negative.   Endo/Heme/Allergies: Does not bruise/bleed easily.    As per HPI. Otherwise, a complete review of systems is negatve.  PAST MEDICAL HISTORY: Past Medical History  Diagnosis Date  . Breast cancer 1997  . Lung cancer 1983  . Cataract     PAST SURGICAL HISTORY: Past Surgical History  Procedure Laterality Date  . Breast lumpectomy    . Partial hysterectomy      FAMILY HISTORY: Reviewed and unchanged. No reported history of malignancy or chronic disease.     ADVANCED DIRECTIVES:    HEALTH MAINTENANCE: Social History  Substance Use Topics  . Smoking status: Never Smoker   . Smokeless tobacco: Never Used  . Alcohol Use: No     Colonoscopy:  PAP:  Bone density:  Lipid panel:  No Known Allergies  Current Outpatient Prescriptions  Medication Sig Dispense Refill  . amLODipine (NORVASC) 5 MG tablet     . aspirin EC 81 MG tablet Take by mouth.    Marland Kitchen atenolol (TENORMIN) 50 MG tablet TAKE ONE TABLET TWICE A DAY    . ketoconazole (NIZORAL) 2 % cream     .  levothyroxine (SYNTHROID, LEVOTHROID) 88 MCG tablet     . losartan (COZAAR) 50 MG tablet Take 1 tablet (50 mg total) by mouth daily.    . Omega-3 Fatty Acids (FISH OIL) 1000 MG CPDR Take by mouth.    . ranitidine (ZANTAC) 150 MG tablet     . simvastatin (ZOCOR) 20 MG tablet TAKE ONE TABLET AT BEDTIME    . triamcinolone ointment (KENALOG) 0.1 %      No current facility-administered medications for this visit.    OBJECTIVE: Filed Vitals:   12/13/14 1359  BP: 119/73  Pulse: 54  Temp: 97.9 F (36.6 C)  Resp: 16     There is no height on file to calculate BMI.    ECOG FS:0 - Asymptomatic  General: Well-developed, well-nourished, no acute distress. Eyes: Pink conjunctiva, anicteric sclera. Lungs: Clear to auscultation bilaterally. Heart: Regular rate and rhythm. No rubs, murmurs, or gallops. Abdomen: Soft, nontender, nondistended. No organomegaly noted, normoactive bowel sounds. Musculoskeletal: No edema, cyanosis, or clubbing. Neuro: Alert, answering all questions appropriately. Cranial nerves grossly intact. Skin: No rashes or petechiae noted. Psych: Normal affect.   LAB RESULTS:  Lab Results  Component Value Date   NA 135* 06/23/2013   K 4.0 06/23/2013   CL 101 06/23/2013   CO2 30 06/23/2013   GLUCOSE 87 06/23/2013   BUN 15 06/23/2013   CREATININE 0.92 06/23/2013   CALCIUM 9.3 06/23/2013   PROT 7.3 06/13/2013   ALBUMIN  3.6 06/13/2013   AST 32 06/13/2013   ALT 19 06/13/2013   ALKPHOS 57 06/13/2013   BILITOT 1.1* 06/13/2013   GFRNONAA 56* 06/23/2013   GFRAA >60 06/23/2013    Lab Results  Component Value Date   WBC 7.5 12/13/2014   NEUTROABS 3.9 12/13/2014   HGB 14.2 12/13/2014   HCT 41.5 12/13/2014   MCV 91.7 12/13/2014   PLT 169 12/13/2014     STUDIES: US Abdomen Limited  11/29/2014   CLINICAL DATA:  Thrombocytopenia  EXAM: ULTRASOUND SPLEEN  COMPARISON:  None.  FINDINGS: Spleen measures 4.6 x 9.7 x 2.8 cm with a measures splenic volume of 65 cubic cm. No  focal splenic lesions are identified. There is no perisplenic fluid or adenopathy.  IMPRESSION: Study within normal limits.   Electronically Signed   By: Lowella Grip III M.D.   On: 11/29/2014 09:51    ASSESSMENT: Thrombocytopenia.  PLAN:    1. Thrombocytopenia: Patient's platelet count is now within normal limits. She was found to have a platelet antibody, but this is likely clinically insignificant. The remainder of her laboratory work was either negative or within normal limits.  Splenic ultrasound results reviewed independently and reported as above with a normal splenic volume. No intervention is needed at this time. Patient does not require a bone marrow biopsy.  No follow-up has been scheduled.  Please refer patient back if her platelet count begins to trend down again for further evaluation.  Patient expressed understanding and was in agreement with this plan. She also understands that She can call clinic at any time with any questions, concerns, or complaints.    Lloyd Huger, MD   12/17/2014 10:46 AM

## 2018-01-08 ENCOUNTER — Emergency Department: Payer: Medicare Other

## 2018-01-08 ENCOUNTER — Other Ambulatory Visit: Payer: Self-pay

## 2018-01-08 ENCOUNTER — Inpatient Hospital Stay
Admission: EM | Admit: 2018-01-08 | Discharge: 2018-01-11 | DRG: 872 | Disposition: A | Discharge status: Home Health | Payer: Medicare Other | Attending: Family Medicine | Admitting: Family Medicine

## 2018-01-08 ENCOUNTER — Encounter: Payer: Self-pay | Admitting: Emergency Medicine

## 2018-01-08 DIAGNOSIS — N10 Acute tubulo-interstitial nephritis: Secondary | ICD-10-CM | POA: Diagnosis not present

## 2018-01-08 DIAGNOSIS — Z79899 Other long term (current) drug therapy: Secondary | ICD-10-CM | POA: Diagnosis not present

## 2018-01-08 DIAGNOSIS — B373 Candidiasis of vulva and vagina: Secondary | ICD-10-CM | POA: Diagnosis present

## 2018-01-08 DIAGNOSIS — Z7982 Long term (current) use of aspirin: Secondary | ICD-10-CM

## 2018-01-08 DIAGNOSIS — N12 Tubulo-interstitial nephritis, not specified as acute or chronic: Secondary | ICD-10-CM

## 2018-01-08 DIAGNOSIS — R531 Weakness: Secondary | ICD-10-CM | POA: Diagnosis present

## 2018-01-08 DIAGNOSIS — M545 Low back pain: Secondary | ICD-10-CM | POA: Diagnosis not present

## 2018-01-08 DIAGNOSIS — Z853 Personal history of malignant neoplasm of breast: Secondary | ICD-10-CM

## 2018-01-08 DIAGNOSIS — K5900 Constipation, unspecified: Secondary | ICD-10-CM | POA: Diagnosis not present

## 2018-01-08 DIAGNOSIS — D696 Thrombocytopenia, unspecified: Secondary | ICD-10-CM | POA: Diagnosis not present

## 2018-01-08 DIAGNOSIS — A4150 Gram-negative sepsis, unspecified: Principal | ICD-10-CM | POA: Diagnosis present

## 2018-01-08 DIAGNOSIS — I10 Essential (primary) hypertension: Secondary | ICD-10-CM | POA: Diagnosis present

## 2018-01-08 DIAGNOSIS — B372 Candidiasis of skin and nail: Secondary | ICD-10-CM | POA: Diagnosis not present

## 2018-01-08 DIAGNOSIS — Z85118 Personal history of other malignant neoplasm of bronchus and lung: Secondary | ICD-10-CM

## 2018-01-08 DIAGNOSIS — R109 Unspecified abdominal pain: Secondary | ICD-10-CM

## 2018-01-08 LAB — COMPREHENSIVE METABOLIC PANEL
ALBUMIN: 3.8 g/dL (ref 3.5–5.0)
ALK PHOS: 43 U/L (ref 38–126)
ALT: 11 U/L (ref 0–44)
AST: 22 U/L (ref 15–41)
Anion gap: 11 (ref 5–15)
BILIRUBIN TOTAL: 1.9 mg/dL — AB (ref 0.3–1.2)
BUN: 14 mg/dL (ref 8–23)
CALCIUM: 8.7 mg/dL — AB (ref 8.9–10.3)
CO2: 27 mmol/L (ref 22–32)
Chloride: 100 mmol/L (ref 98–111)
Creatinine, Ser: 1.1 mg/dL — ABNORMAL HIGH (ref 0.44–1.00)
GFR calc Af Amer: 49 mL/min — ABNORMAL LOW (ref >60)
GFR calc non Af Amer: 42 mL/min — ABNORMAL LOW (ref >60)
GLUCOSE: 130 mg/dL — AB (ref 70–99)
Potassium: 3.6 mmol/L (ref 3.5–5.1)
SODIUM: 138 mmol/L (ref 135–145)
TOTAL PROTEIN: 6.6 g/dL (ref 6.5–8.1)

## 2018-01-08 LAB — CBC
HEMATOCRIT: 39.3 % (ref 35.0–47.0)
HEMOGLOBIN: 13.7 g/dL (ref 12.0–16.0)
MCH: 32.7 pg (ref 26.0–34.0)
MCHC: 34.8 g/dL (ref 32.0–36.0)
MCV: 93.9 fL (ref 80.0–100.0)
Platelets: 96 10*3/uL — ABNORMAL LOW (ref 150–440)
RBC: 4.19 MIL/uL (ref 3.80–5.20)
RDW: 13.3 % (ref 11.5–14.5)
WBC: 8.1 10*3/uL (ref 3.6–11.0)

## 2018-01-08 LAB — URINALYSIS, COMPLETE (UACMP) WITH MICROSCOPIC
BILIRUBIN URINE: NEGATIVE
Glucose, UA: NEGATIVE mg/dL
Ketones, ur: NEGATIVE mg/dL
NITRITE: POSITIVE — AB
PROTEIN: 30 mg/dL — AB
Specific Gravity, Urine: 1.03 (ref 1.005–1.030)
pH: 5 (ref 5.0–8.0)

## 2018-01-08 LAB — LACTIC ACID, PLASMA
LACTIC ACID, VENOUS: 1.5 mmol/L (ref 0.5–1.9)
Lactic Acid, Venous: 1.4 mmol/L (ref 0.5–1.9)

## 2018-01-08 LAB — LIPASE, BLOOD: LIPASE: 24 U/L (ref 11–51)

## 2018-01-08 MED ORDER — NYSTATIN 100000 UNIT/GM EX CREA
TOPICAL_CREAM | Freq: Two times a day (BID) | CUTANEOUS | Status: DC
  Administered 2018-01-08 – 2018-01-11 (×5): via TOPICAL
  Filled 2018-01-08: qty 15

## 2018-01-08 MED ORDER — SODIUM CHLORIDE 0.9 % IV SOLN
1.0000 g | Freq: Once | INTRAVENOUS | Status: AC
  Administered 2018-01-08: 1 g via INTRAVENOUS
  Filled 2018-01-08: qty 10

## 2018-01-08 MED ORDER — ATENOLOL 25 MG PO TABS: 50.0000 mg | ORAL_TABLET | Freq: Two times a day (BID) | ORAL | Status: DC

## 2018-01-08 MED ORDER — ONDANSETRON HCL 4 MG/2ML IJ SOLN
4.0000 mg | Freq: Four times a day (QID) | INTRAMUSCULAR | Status: DC | PRN
  Administered 2018-01-10 (×2): 4 mg via INTRAVENOUS
  Filled 2018-01-08 (×2): qty 2

## 2018-01-08 MED ORDER — ONDANSETRON HCL 4 MG/2ML IJ SOLN
4.0000 mg | Freq: Once | INTRAMUSCULAR | Status: AC
  Administered 2018-01-08: 4 mg via INTRAVENOUS
  Filled 2018-01-08: qty 2

## 2018-01-08 MED ORDER — LOSARTAN POTASSIUM 50 MG PO TABS: 25.0000 mg | ORAL_TABLET | Freq: Every day | ORAL | Status: DC

## 2018-01-08 MED ORDER — HYDROCODONE-ACETAMINOPHEN 5-325 MG PO TABS: 1.0000 | ORAL_TABLET | ORAL | Status: DC | PRN

## 2018-01-08 MED ORDER — TRAZODONE HCL 50 MG PO TABS: 25.0000 mg | ORAL_TABLET | Freq: Every evening | ORAL | Status: DC | PRN

## 2018-01-08 MED ORDER — ACETAMINOPHEN 325 MG PO TABS
650.0000 mg | ORAL_TABLET | Freq: Four times a day (QID) | ORAL | Status: DC | PRN
  Administered 2018-01-08: 650 mg via ORAL
  Filled 2018-01-08: qty 2

## 2018-01-08 MED ORDER — BISACODYL 5 MG PO TBEC
5.0000 mg | DELAYED_RELEASE_TABLET | Freq: Every day | ORAL | Status: DC | PRN
  Administered 2018-01-10: 5 mg via ORAL
  Filled 2018-01-08: qty 1

## 2018-01-08 MED ORDER — LOSARTAN POTASSIUM 50 MG PO TABS: 50.0000 mg | ORAL_TABLET | Freq: Every day | ORAL | Status: DC

## 2018-01-08 MED ORDER — SODIUM CHLORIDE 0.9 % IV SOLN
INTRAVENOUS | Status: DC
  Administered 2018-01-08 – 2018-01-10 (×2): via INTRAVENOUS

## 2018-01-08 MED ORDER — SODIUM CHLORIDE 0.9 % IV SOLN
1000.0000 mL | Freq: Once | INTRAVENOUS | Status: AC
  Administered 2018-01-08: 1000 mL via INTRAVENOUS

## 2018-01-08 MED ORDER — OMEGA-3-ACID ETHYL ESTERS 1 G PO CAPS
1.0000 | ORAL_CAPSULE | Freq: Every day | ORAL | Status: DC
  Administered 2018-01-09 – 2018-01-11 (×3): 1 g via ORAL
  Filled 2018-01-08 (×3): qty 1

## 2018-01-08 MED ORDER — IOHEXOL 300 MG/ML  SOLN
75.0000 mL | Freq: Once | INTRAMUSCULAR | Status: AC | PRN
  Administered 2018-01-08: 75 mL via INTRAVENOUS

## 2018-01-08 MED ORDER — ENOXAPARIN SODIUM 40 MG/0.4ML ~~LOC~~ SOLN
40.0000 mg | SUBCUTANEOUS | Status: DC
  Administered 2018-01-08 – 2018-01-10 (×3): 40 mg via SUBCUTANEOUS
  Filled 2018-01-08 (×3): qty 0.4

## 2018-01-08 MED ORDER — ASPIRIN EC 81 MG PO TBEC
81.0000 mg | DELAYED_RELEASE_TABLET | Freq: Every day | ORAL | Status: DC
  Administered 2018-01-09 – 2018-01-11 (×3): 81 mg via ORAL
  Filled 2018-01-08 (×3): qty 1

## 2018-01-08 MED ORDER — AMLODIPINE BESYLATE 5 MG PO TABS: 5.0000 mg | ORAL_TABLET | Freq: Every day | ORAL | Status: DC

## 2018-01-08 MED ORDER — ATENOLOL 25 MG PO TABS: 25.0000 mg | ORAL_TABLET | Freq: Two times a day (BID) | ORAL | Status: DC

## 2018-01-08 MED ORDER — SIMVASTATIN 10 MG PO TABS
20.0000 mg | ORAL_TABLET | Freq: Every day | ORAL | Status: DC
  Administered 2018-01-08 – 2018-01-10 (×3): 20 mg via ORAL
  Filled 2018-01-08 (×4): qty 2

## 2018-01-08 MED ORDER — ACETAMINOPHEN 650 MG RE SUPP: 650.0000 mg | Freq: Four times a day (QID) | RECTAL | Status: DC | PRN

## 2018-01-08 MED ORDER — ONDANSETRON HCL 4 MG PO TABS: 4.0000 mg | ORAL_TABLET | Freq: Four times a day (QID) | ORAL | Status: DC | PRN

## 2018-01-08 MED ORDER — SODIUM CHLORIDE 0.9 % IV SOLN
1.0000 g | INTRAVENOUS | Status: DC
  Administered 2018-01-09 – 2018-01-10 (×2): 1 g via INTRAVENOUS
  Filled 2018-01-08 (×2): qty 1
  Filled 2018-01-08: qty 10

## 2018-01-08 MED ORDER — DOCUSATE SODIUM 100 MG PO CAPS
100.0000 mg | ORAL_CAPSULE | Freq: Two times a day (BID) | ORAL | Status: DC
  Administered 2018-01-08 – 2018-01-11 (×6): 100 mg via ORAL
  Filled 2018-01-08 (×6): qty 1

## 2018-01-08 MED ORDER — LEVOTHYROXINE SODIUM 50 MCG PO TABS
100.0000 ug | ORAL_TABLET | Freq: Every day | ORAL | Status: DC
  Administered 2018-01-09 – 2018-01-11 (×3): 100 ug via ORAL
  Filled 2018-01-08 (×3): qty 2

## 2018-01-08 NOTE — ED Provider Notes (Signed)
[facility] Emergency Department Provider Note   ____________________________________________    I have reviewed the triage vital signs and the nursing notes.   HISTORY  Chief Complaint Abdominal Pain and Weakness     HPI Savannah [name] is a 84 y.o. female who presents with complaints of abdominal pain, fatigue and weakness.  Patient reports she woke up this morning in the back of her neck was sweaty she is quite unusual for her.  She is not sure if she had a fever.  She complains of pain in her right lower quadrant and right flank which she describes as moderate, primarily occurs with palpation.  Denies dysuria but has had urinary tract infections in the past.  Has not taken anything for this.   Past Medical History:  Diagnosis Date  . Breast cancer ([place]) 1997  . Cataract   . Lung cancer ([place]) 1983    There are no active problems to display for this patient.   Past Surgical History:  Procedure Laterality Date  . ABDOMINAL HYSTERECTOMY    . BREAST LUMPECTOMY    . PARTIAL HYSTERECTOMY      Prior to Admission medications   Medication Sig Start Date End Date Taking? Authorizing Provider  amLODipine (NORVASC) 5 MG tablet Take 5 mg by mouth daily.  10/25/14  Yes [provider]  atenolol (TENORMIN) 50 MG tablet TAKE ONE TABLET TWICE A DAY 06/08/14  Yes [provider]  ketoconazole (NIZORAL) 2 % cream Apply topically 2 (two) times daily.  10/11/14  Yes [provider]  levothyroxine (SYNTHROID, LEVOTHROID) 100 MCG tablet Take 100 mcg by mouth daily. 12/30/17  Yes [provider]  losartan (COZAAR) 50 MG tablet Take 1 tablet (50 mg total) by mouth daily. 11/27/14  Yes [provider]  ranitidine (ZANTAC) 150 MG tablet Take 150 mg by mouth 2 (two) times daily.  10/25/14  Yes [provider]  simvastatin (ZOCOR) 20 MG tablet TAKE ONE TABLET AT BEDTIME 03/13/14  Yes [provider]  aspirin EC 81  MG tablet Take by mouth.    [provider]  Omega-3 Fatty Acids (FISH OIL) 1000 MG CPDR Take by mouth. 11/27/14   [provider]  triamcinolone ointment (KENALOG) 0.1 %  10/10/14   [provider]     Allergies Patient has no known allergies.  History reviewed. No pertinent family history.  Social History Social History   Tobacco Use  . Smoking status: Never Smoker  . Smokeless tobacco: Never Used  Substance Use Topics  . Alcohol use: No  . Drug use: No    Review of Systems  Constitutional: No fever/chills Eyes: No visual changes.  ENT: No sore throat. Cardiovascular: Denies chest pain. Respiratory: Denies shortness of breath. Gastrointestinal: As above Genitourinary: As above Musculoskeletal: Negative for back pain. Skin: Negative for rash. Neurological: Negative for headaches or weakness   ____________________________________________   PHYSICAL EXAM:  VITAL SIGNS: ED Triage Vitals  Enc Vitals Group     BP 01/08/18 1300 (!) 96/43     Pulse Rate 01/08/18 1300 73     Resp 01/08/18 1300 (!) 27     Temp --      Temp src --      SpO2 01/08/18 1300 92 %     Weight 01/08/18 0918 72.6 kg (160 lb)     Height 01/08/18 0918 1.6 m (5\' 3" )     Head Circumference --  Peak Flow --      Pain Score 01/08/18 0918 0     Pain Loc --      Pain Edu? --      Excl. in [place]? --     Constitutional: Alert and oriented.   Nose: No congestion/rhinnorhea. Mouth/Throat: Mucous membranes are moist.   Neck:  Painless ROM Cardiovascular: Normal rate, regular rhythm. Grossly normal heart sounds.  Good peripheral circulation. Respiratory: Normal respiratory effort.  No retractions. Lungs CTAB. Gastrointestinal: Mild tenderness in the right lower quadrant r. No distention.  Mild right CVA tenderness.  Musculoskeletal:   Warm and well perfused Neurologic:  Normal speech and language. No gross focal neurologic deficits are appreciated.  Skin:  Skin is  warm, dry and intact. No rash noted. Psychiatric: Mood and affect are normal. Speech and behavior are normal.  ____________________________________________   LABS (all labs ordered are listed, but only abnormal results are displayed)  Labs Reviewed  COMPREHENSIVE METABOLIC PANEL - Abnormal; Notable for the following components:      Result Value   Glucose, Bld 130 (*)    Creatinine, Ser 1.10 (*)    Calcium 8.7 (*)    Total Bilirubin 1.9 (*)    GFR calc non Af Amer 42 (*)    GFR calc Af Amer 49 (*)    All other components within normal limits  CBC - Abnormal; Notable for the following components:   Platelets 96 (*)    All other components within normal limits  URINALYSIS, COMPLETE (UACMP) WITH MICROSCOPIC - Abnormal; Notable for the following components:   Color, Urine YELLOW (*)    APPearance CLOUDY (*)    Hgb urine dipstick SMALL (*)    Protein, ur 30 (*)    Nitrite POSITIVE (*)    Leukocytes, UA LARGE (*)    WBC, UA >50 (*)    Bacteria, UA MANY (*)    Non Squamous Epithelial PRESENT (*)    All other components within normal limits  LIPASE, BLOOD   ____________________________________________  EKG  ED ECG REPORT I, Lavonia Drafts, the attending physician, personally viewed and interpreted this ECG.  Date: 01/08/2018  Rhythm: normal sinus rhythm QRS Axis: normal Intervals: normal ST/T Wave abnormalities: normal Narrative Interpretation: no evidence of acute ischemia  ____________________________________________  RADIOLOGY  CT scan demonstrates signs consistent with pyelonephritis ____________________________________________   PROCEDURES  Procedure(s) performed: No  Procedures   Critical Care performed: No ____________________________________________   INITIAL IMPRESSION / ASSESSMENT AND PLAN / ED COURSE  Pertinent labs & imaging results that were available during my care of the patient were reviewed by me and considered in my medical decision  making (see chart for details).  Patient presents with right-sided flank pain, right lower quadrant pain.  No reports of dysuria.  Differential includes ureterolithiasis, UTI, [place], diverticulitis.  We will check labs and obtain CT of the pelvis.  Patient declined IV analgesics.  IV Zofran given with fluids   CT demonstrates likely pyelonephritis, urine pending   Urinalysis confirms significant UTI, will treat with IV Rocephin and admit to the hospital service    ____________________________________________   FINAL CLINICAL IMPRESSION(S) / ED DIAGNOSES  Final diagnoses:  Pyelonephritis        Note:  This document was prepared using Dragon voice recognition software and may include unintentional dictation errors.    Lavonia Drafts, MD 01/08/18 1357

## 2018-01-08 NOTE — ED Notes (Signed)
Patient transported to CT 

## 2018-01-08 NOTE — Progress Notes (Addendum)
Nurse called to room by IV team nurse and told that patient was having chills. Upon arrival to room patient was lying in bed shivering and stated " I'm so cold". Vital signs were taken and in normal range except for respirations they were at 28. Hospitalist has ben paged and nurse waiting on return call. Dr. Vianne Bulls notified of above mentioned, and advised nurse to continue monitoring patient and cover her with heated blankets and allow for antibiotics to take effect as patient has just begun treatment.

## 2018-01-08 NOTE — ED Triage Notes (Addendum)
Pt arrived via EMS from home with reports of RLQ abd pain tender to palpation and radiates to the low back, nausea and weakness. Pt states she has not felt good since yesterday and c/o dry mouth.   Per EMS initial oxygen sats were 86% on RA and pt was placed on 2L.  On arrival to ED pt's oxygen on RA was 91%

## 2018-01-08 NOTE — Plan of Care (Signed)
Resting with eyes closed,no visual distress noted at present,callbell within reach.Will continue with planned regimen.

## 2018-01-08 NOTE — H&P (Signed)
[facility] [place] NAME: Savannah Savannah [name]    MR#:  [id number]  DATE OF BIRTH:  1922-08-08  DATE OF ADMISSION:  01/08/2018  PRIMARY CARE PHYSICIAN: Savannah Hire, MD   REQUESTING/REFERRING PHYSICIAN: Dr. Corky [name]  CHIEF COMPLAINT: Right flank pain, back   Chief Complaint  Savannah [name] presents with  . Abdominal Pain  . Weakness    HISTORY OF PRESENT ILLNESS:  Savannah Savannah [name]  is a 84 y.o. female with a known history of essential hypertension, who is very independent comes in because of right flank pain, chills for Savannah past 3 days.  Savannah [name] has nausea, back pain.  Noted to have UTI, right pyelonephritis.  PAST MEDICAL HISTORY:   Past Medical History:  Diagnosis Date  . Breast cancer ([place]) 1997  . Cataract   . Lung cancer ([place]) 1983    PAST SURGICAL HISTOIRY:   Past Surgical History:  Procedure Laterality Date  . ABDOMINAL HYSTERECTOMY    . BREAST LUMPECTOMY    . PARTIAL HYSTERECTOMY      SOCIAL HISTORY:   Social History   Tobacco Use  . Smoking status: Never Smoker  . Smokeless tobacco: Never Used  Substance Use Topics  . Alcohol use: No    FAMILY HISTORY:  History reviewed. No pertinent family history.  DRUG ALLERGIES:  No Known Allergies  REVIEW OF SYSTEMS:  CONSTITUTIONAL: subjective fever, nausea, chills Right  flank pain. eYES: No blurred or double vision.  EARS, NOSE, AND THROAT: No tinnitus or ear pain.  RESPIRATORY: No cough, shortness of breath, wheezing or hemoptysis.  CARDIOVASCULAR: No chest pain, orthopnea, edema.  GASTROINTESTINAL: No nausea, vomiting, diarrhea or abdominal pain.  GENITOURINARY: No dysuria, hematuria.  ENDOCRINE: No polyuria, nocturia,  HEMATOLOGY: No anemia, easy bruising or bleeding SKIN: No rash or lesion. MUSCULOSKELETAL: No joint pain or arthritis.   NEUROLOGIC: No tingling, numbness, weakness.  PSYCHIATRY: No anxiety or depression.   MEDICATIONS AT HOME:   Prior to  Admission medications   Medication Sig Start Date End Date Taking? Authorizing Provider  amLODipine (NORVASC) 5 MG tablet Take 5 mg by mouth daily.  10/25/14  Yes [provider]  aspirin EC 81 MG tablet Take 81 mg by mouth daily.    Yes [provider]  atenolol (TENORMIN) 50 MG tablet TAKE ONE TABLET TWICE A DAY 06/08/14  Yes [provider]  ketoconazole (NIZORAL) 2 % cream Apply topically daily as needed for irritation.  10/11/14  Yes [provider]  levothyroxine (SYNTHROID, LEVOTHROID) 100 MCG tablet Take 100 mcg by mouth daily. 12/30/17  Yes [provider]  losartan (COZAAR) 50 MG tablet Take 1 tablet (50 mg total) by mouth daily. 11/27/14  Yes [provider]  Omega-3 Fatty Acids (FISH OIL) 1000 MG CPDR Take 1 capsule by mouth daily.  11/27/14  Yes [provider]  ranitidine (ZANTAC) 150 MG tablet Take 150 mg by mouth 2 (two) times daily.  10/25/14  Yes [provider]  simvastatin (ZOCOR) 20 MG tablet TAKE ONE TABLET AT BEDTIME 03/13/14  Yes [provider]  triamcinolone ointment (KENALOG) 0.1 % Apply topically daily as needed.  10/10/14  Yes [provider]      VITAL SIGNS:  Blood pressure (!) 102/41, pulse 70, resp. rate (!) 28, height 5\' 3"  (1.6 m), weight 72.6 kg, SpO2 90 %.  PHYSICAL EXAMINATION:  GENERAL:  84 y.o.-year-old Savannah [name] lying in [place] bed with no acute distress.  EYES:  Pupils equal, round, reactive to light and accommodation. No scleral icterus. Extraocular muscles intact.  HEENT: Head atraumatic, normocephalic. Oropharynx and nasopharynx clear.  NECK:  Supple, no jugular venous distention. No thyroid enlargement, no tenderness.  LUNGS: Normal breath sounds bilaterally, no wheezing, rales,rhonchi or crepitation. No use of accessory muscles of respiration.  CARDIOVASCULAR: S1, S2 normal. No murmurs, rubs, or gallops.  ABDOMEN: Soft, nontender, nondistended.  Suprapubic tenderness  present.  EXTREMITIES: No pedal edema, cyanosis, or clubbing.  NEUROLOGIC: Cranial nerves II through XII are intact. Muscle strength 5/5 in all extremities. Sensation intact. Gait not checked.  PSYCHIATRIC: Savannah Savannah [name] is alert and oriented x 3.  SKIN: No obvious rash, lesion, or ulcer.   LABORATORY PANEL:   CBC Recent Labs  Lab 01/08/18 0922  WBC 8.1  HGB 13.7  HCT 39.3  PLT 96*   ------------------------------------------------------------------------------------------------------------------  Chemistries  Recent Labs  Lab 01/08/18 0922  NA 138  K 3.6  CL 100  CO2 27  GLUCOSE 130*  BUN 14  CREATININE 1.10*  CALCIUM 8.7*  AST 22  ALT 11  ALKPHOS 43  BILITOT 1.9*   ------------------------------------------------------------------------------------------------------------------  Cardiac Enzymes No results for input(s): TROPONINI in [place]annah last 168 hours. ------------------------------------------------------------------------------------------------------------------  RADIOLOGY:  Ct Abdomen Pelvis W Contrast  Result Date: 01/08/2018 CLINICAL DATA:  84 year old female with a history breast carcinoma and lung cancer EXAM: CT ABDOMEN AND PELVIS WITH CONTRAST TECHNIQUE: Multidetector CT imaging of [place]annah abdomen and pelvis was performed using [place]annah standard protocol following bolus administration of intravenous contrast. CONTRAST:  81mL OMNIPAQUE IOHEXOL 300 MG/ML  SOLN COMPARISON:  None. FINDINGS: Lower chest: Atelectasis/scarring at Savannah lung bases. No acute finding. Hepatobiliary: There are several low-density cystic structures of [place]annah liver, including: -segment 8 measuring 17 mm on image 11 -segment 2 measuring 22 mm x 20 mm on image 13 -segment 4A measuring 21 mm, image 17, as well as a smaller more posterior cyst on image 14 Questionable biliary ductal dilatation associated with [place]annah segment 4A lesion Cholelithiasis without inflammatory changes associated with Savannah gallbladder.  Pancreas: Unremarkable pancreas Spleen: Unremarkable spleen Adrenals/Urinary Tract: Unremarkable adrenal glands. Patchy appearance of Savannah right CT nephrogram, with regions of decreased attenuation/enhancement, most pronounced at Savannah anterior cortex. No hydronephrosis. No nephrolithiasis. Vascular calcifications at Savannah hilum of Savannah right kidney. Inflammatory changes in Savannah fat at Savannah right kidney. Unremarkable appearance of [place]annah length of Savannah right ureter. Left kidney without hydronephrosis. Regions of left cortical thinning. No nephrolithiasis. There is a more uniform appearance of Savannah perfusion of Savannah left kidney. Unremarkable course Savannah left ureter. Urinary bladder partially distended. Bilateral urinary bladder diverticulum at Savannah bladder base. Stomach/Bowel: Paraesophageal hernia. Otherwise unremarkable stomach. Small bowel without abnormal distention. No focal inflammatory changes. No transition point. Appendix is not visualized, however, no inflammatory changes are present adjacent to Savannah cecum to indicate an appendicitis. Extensive diverticular disease. No inflammatory changes adjacent to sigmoid colon, left colon, transverse colon. Inflammatory changes adjacent to Savannah right kidney at Savannah interface of right kidney and Savannah hepatic flexure, favored to be related to Savannah kidney. Vascular/Lymphatic: Vascular calcifications of Savannah abdominal aorta. Calcifications of [place] mesenteric arteries and bilateral renal arteries. No arterial occlusion identified. Greatest diameter of Sav[place] abdominal aorta measures 2.4 cm. Bilateral iliac arteries and femoral arteries are patent. Reproductive: Hysterectomy. Other: None Musculoskeletal: Osteopenia. Multilevel degenerative changes of Savannah thoracolumbar spine. Narrowing of Savannah disc space at L2-L3 with endplate changes. Bilateral facet changes. IMPRESSION: Patchy appearance of Savannah right CT pyelogram, suggesting  pyelonephritis, less likely ischemia. Recommend correlation with urinalysis.  Inflammatory changes in Savannah right perinephric region adjacent to hepatic flexure, are favored to be related to Savannah kidney and not secondary to diverticular disease, however, there are extensive diverticular changes throughout Savannah colon. Low-density and nonenhancing cystic lesions of Savannah liver, likely benign. Questionable mild biliary ductal dilatation adjacent to Savannah segment 4A cyst versus perfusion anomaly. Correlation with lab values may be useful if there is concern for acute biliary abnormality. If further imaging evaluation is warranted, contrast-enhanced MR liver protocol would be Savannah test of choice. Cholelithiasis without evidence of occult cholecystitis. Aortic atherosclerosis with associated renal and mesenteric arterial disease. Aortic Atherosclerosis (ICD10-I70.0). Paraesophageal hernia. Electronically Signed   By: Corrie Mckusick D.O.   On: 01/08/2018 11:20    EKG:   Orders placed or performed during [place] hospital encounter of 01/08/18  . ED EKG  . ED EKG  . EKG 12-Lead  . EKG 12-Lead  . EKG 12-Lead  . EKG 12-Lead    IMPRESSION AND PLAN:   84 year old female Savannah [name] with hypertension, comes in because of right flank pain, subjective fever, chills, nausea found to have right pyelonephritis, UTI.  Savannah [name] also is hypotensive, received 1 L of normal saline in [facility] emergency room so far. 1.  Right pyelonephritis: Savannah [name] has UTI, admitted for [place]annah same, started on IV Rocephin, follow urine cultures, surprisingly Savannah [name] CBC, [place] are within normal limits.  Because of advanced age admitting Savannah Savannah [name]. 2.  Hypotension likely due to UTI ; lactic acid to evaluate for sepsis: Hold antihypertensives namely Norvasc, atenolol, Cozaar. 3. thrombocytopenia: Chronic.   All [place] records are reviewed and case discussed with ED provider. Management plans discussed with Savannah Savannah [name], family and they are in agreement.  CODE STATUS:full  TOTAL TIME TAKING CARE OF THIS Savannah [name]: 58minutes.     Epifanio Lesches M.D on 01/08/2018 at 2:41 PM  Between 7am to 6pm - Pager - [phone]  After 6pm go to [email or web address] - password [email or web address] [facility]  Office  [phone]  CC: Primary care physician; Savannah Hire, MD  Note: This dictation was prepared with Dragon dictation along with smaller phrase technology. Any transcriptional errors that result from this process are unintentional.

## 2018-01-09 DIAGNOSIS — N12 Tubulo-interstitial nephritis, not specified as acute or chronic: Secondary | ICD-10-CM | POA: Diagnosis not present

## 2018-01-09 DIAGNOSIS — A4150 Gram-negative sepsis, unspecified: Secondary | ICD-10-CM | POA: Diagnosis not present

## 2018-01-09 LAB — BASIC METABOLIC PANEL
ANION GAP: 8 (ref 5–15)
BUN: 22 mg/dL (ref 8–23)
CHLORIDE: 103 mmol/L (ref 98–111)
CO2: 26 mmol/L (ref 22–32)
Calcium: 8.1 mg/dL — ABNORMAL LOW (ref 8.9–10.3)
Creatinine, Ser: 1.1 mg/dL — ABNORMAL HIGH (ref 0.44–1.00)
GFR calc non Af Amer: 42 mL/min — ABNORMAL LOW (ref >60)
GFR, EST AFRICAN AMERICAN: 49 mL/min — AB (ref >60)
GLUCOSE: 92 mg/dL (ref 70–99)
Potassium: 3.6 mmol/L (ref 3.5–5.1)
Sodium: 137 mmol/L (ref 135–145)

## 2018-01-09 LAB — CBC
HEMATOCRIT: 31.6 % — AB (ref 35.0–47.0)
HEMOGLOBIN: 11.3 g/dL — AB (ref 12.0–16.0)
MCH: 33.4 pg (ref 26.0–34.0)
MCHC: 35.8 g/dL (ref 32.0–36.0)
MCV: 93.4 fL (ref 80.0–100.0)
PLATELETS: 109 10*3/uL — AB (ref 150–440)
RBC: 3.38 MIL/uL — ABNORMAL LOW (ref 3.80–5.20)
RDW: 13.4 % (ref 11.5–14.5)
WBC: 15.3 10*3/uL — ABNORMAL HIGH (ref 3.6–11.0)

## 2018-01-09 LAB — GLUCOSE, CAPILLARY: GLUCOSE-CAPILLARY: 76 mg/dL (ref 70–99)

## 2018-01-09 NOTE — Progress Notes (Signed)
[facility] [place] NAME: Savannah [name]    MR#:  [id number]  DATE OF BIRTH:  12-23-1922  SUBJECTIVE: Admitted yesterday for pyelonephritis.  Patient has no further fever, has back pain but overall feeling better than yesterday.  Still has low blood pressure, poor p.o. intake, weakness.  CHIEF COMPLAINT:   Chief Complaint  Patient presents with  . Abdominal Pain  . Weakness   Regarding vaginal discharge yesterday, and candidiasis under the breast so started on nystatin powder. REVIEW OF SYSTEMS:   ROS CONSTITUTIONAL: No fever, f does have generalized weakness.   EYES: No blurred or double vision.  EARS, NOSE, AND THROAT: No tinnitus or ear pain.  RESPIRATORY: No cough, shortness of breath, wheezing or hemoptysis.  CARDIOVASCULAR: No chest pain, orthopnea, edema.  GASTROINTESTINAL: No nausea, vomiting, diarrhea or abdominal pain.  GENITOURINARY: No dysuria, hematuria.  ENDOCRINE: No polyuria, nocturia,  HEMATOLOGY: No anemia, easy bruising or bleeding SKIN: No rash or lesion. MUSCULOSKELETAL: No joint pain or arthritis.   NEUROLOGIC: No tingling, numbness, weakness.  PSYCHIATRY: No anxiety or depression.   DRUG ALLERGIES:  No Known Allergies  VITALS:  Blood pressure (!) 135/46, pulse 69, temperature 97.6 F (36.4 C), temperature source Oral, resp. rate 18, height 5\' 3"  (1.6 m), weight 72.6 kg, SpO2 91 %.  PHYSICAL EXAMINATION:  GENERAL:  84 y.o.-year-old patient lying in the bed with no acute distress.  EYES: Pupils equal, round, reactive to light and accommodation. No scleral icterus. Extraocular muscles intact.  HEENT: Head atraumatic, normocephalic. Oropharynx and nasopharynx clear.  NECK:  Supple, no jugular venous distention. No thyroid enlargement, no tenderness.  LUNGS: Normal breath sounds bilaterally, no wheezing, rales,rhonchi or crepitation. No use of accessory muscles of respiration.  CARDIOVASCULAR: S1, S2  normal. No murmurs, rubs, or gallops.  ABDOMEN: Soft, nontender, nondistended. Bowel sounds present. No organomegaly or mass.  EXTREMITIES: No pedal edema, cyanosis, or clubbing.  NEUROLOGIC: Cranial nerves II through XII are intact. Muscle strength 5/5 in all extremities. Sensation intact. Gait not checked.  PSYCHIATRIC: The patient is alert and oriented x 3.  SKIN: No obvious rash, lesion, or ulcer.    LABORATORY PANEL:   CBC Recent Labs  Lab 01/09/18 0636  WBC 15.3*  HGB 11.3*  HCT 31.6*  PLT 109*   ------------------------------------------------------------------------------------------------------------------  Chemistries  Recent Labs  Lab 01/08/18 0922 01/09/18 0636  NA 138 137  K 3.6 3.6  CL 100 103  CO2 27 26  GLUCOSE 130* 92  BUN 14 22  CREATININE 1.10* 1.10*  CALCIUM 8.7* 8.1*  AST 22  --   ALT 11  --   ALKPHOS 43  --   BILITOT 1.9*  --    ------------------------------------------------------------------------------------------------------------------  Cardiac Enzymes No results for input(s): TROPONINI in the last 168 hours. ------------------------------------------------------------------------------------------------------------------  RADIOLOGY:  Ct Abdomen Pelvis W Contrast  Result Date: 01/08/2018 CLINICAL DATA:  84 year old female with a history breast carcinoma and lung cancer EXAM: CT ABDOMEN AND PELVIS WITH CONTRAST TECHNIQUE: Multidetector CT imaging of the abdomen and pelvis was performed using the standard protocol following bolus administration of intravenous contrast. CONTRAST:  26mL OMNIPAQUE IOHEXOL 300 MG/ML  SOLN COMPARISON:  None. FINDINGS: Lower chest: Atelectasis/scarring at the lung bases. No acute finding. Hepatobiliary: There are several low-density cystic structures of the liver, including: -segment 8 measuring 17 mm on image 11 -segment 2 measuring 22 mm x 20 mm on image 13 -segment 4A measuring 21 mm, image 17, as  well as a  smaller more posterior cyst on image 14 Questionable biliary ductal dilatation associated with the segment 4A lesion Cholelithiasis without inflammatory changes associated with the gallbladder. Pancreas: Unremarkable pancreas Spleen: Unremarkable spleen Adrenals/Urinary Tract: Unremarkable adrenal glands. Patchy appearance of the right CT nephrogram, with regions of decreased attenuation/enhancement, most pronounced at the anterior cortex. No hydronephrosis. No nephrolithiasis. Vascular calcifications at the hilum of the right kidney. Inflammatory changes in the fat at the right kidney. Unremarkable appearance of the length of the right ureter. Left kidney without hydronephrosis. Regions of left cortical thinning. No nephrolithiasis. There is a more uniform appearance of the perfusion of the left kidney. Unremarkable course the left ureter. Urinary bladder partially distended. Bilateral urinary bladder diverticulum at the bladder base. Stomach/Bowel: Paraesophageal hernia. Otherwise unremarkable stomach. Small bowel without abnormal distention. No focal inflammatory changes. No transition point. Appendix is not visualized, however, no inflammatory changes are present adjacent to the cecum to indicate an appendicitis. Extensive diverticular disease. No inflammatory changes adjacent to sigmoid colon, left colon, transverse colon. Inflammatory changes adjacent to the right kidney at the interface of right kidney and the hepatic flexure, favored to be related to the kidney. Vascular/Lymphatic: Vascular calcifications of the abdominal aorta. Calcifications of the mesenteric arteries and bilateral renal arteries. No arterial occlusion identified. Greatest diameter of the abdominal aorta measures 2.4 cm. Bilateral iliac arteries and femoral arteries are patent. Reproductive: Hysterectomy. Other: None Musculoskeletal: Osteopenia. Multilevel degenerative changes of the thoracolumbar spine. Narrowing of the disc space at  L2-L3 with endplate changes. Bilateral facet changes. IMPRESSION: Patchy appearance of the right CT pyelogram, suggesting pyelonephritis, less likely ischemia. Recommend correlation with urinalysis. Inflammatory changes in the right perinephric region adjacent to hepatic flexure, are favored to be related to the kidney and not secondary to diverticular disease, however, there are extensive diverticular changes throughout the colon. Low-density and nonenhancing cystic lesions of the liver, likely benign. Questionable mild biliary ductal dilatation adjacent to the segment 4A cyst versus perfusion anomaly. Correlation with lab values may be useful if there is concern for acute biliary abnormality. If further imaging evaluation is warranted, contrast-enhanced MR liver protocol would be the test of choice. Cholelithiasis without evidence of occult cholecystitis. Aortic atherosclerosis with associated renal and mesenteric arterial disease. Aortic Atherosclerosis (ICD10-I70.0). Paraesophageal hernia. Electronically Signed   By: Corrie Mckusick D.O.   On: 01/08/2018 11:20    EKG:   Orders placed or performed during the hospital encounter of 01/08/18  . ED EKG  . ED EKG  . EKG 12-Lead  . EKG 12-Lead  . EKG 12-Lead  . EKG 12-Lead    ASSESSMENT AND PLAN:   84 year old frail female with essential hypertension who is very active comes and because of back pain, flank pain found to have pyelonephritis. 1.  Sepsis due to acute pyelonephritis acute pyelonephritis; follow urine cultures, continue IV fluids,, blood cultures are sent in the emergency room but no results are available. 2.  Hypotension due to sepsis: Improving, blood pressure was low yesterday but better today, BP medicines are on hold resume from tomorrow. 3.  Generalized weakness: Physical therapy consulted  #.4.possible candida vaginitis;continue nystatin powder  With daughters. All the records are reviewed and case discussed with Care  Management/Social Workerr. Management plans discussed with the patient, family and they are in agreement.  CODE STATUS: Full code  TOTAL TIME TAKING CARE OF THIS PATIENT: 35 minutes.   POSSIBLE D/C IN 1-2DAYS, DEPENDING ON CLINICAL CONDITION.   Trayvion Embleton Rene Paci.D  on 01/09/2018 at 4:05 PM  Between 7am to 6pm - Pager - [phone]  After 6pm go to [email or web address] - password [email or web address] [facility]  Office  [phone]  CC: Primary care physician; Baxter Hire, MD   Note: This dictation was prepared with Dragon dictation along with smaller phrase technology. Any transcriptional errors that result from this process are unintentional.

## 2018-01-10 ENCOUNTER — Inpatient Hospital Stay: Payer: Medicare Other

## 2018-01-10 DIAGNOSIS — N12 Tubulo-interstitial nephritis, not specified as acute or chronic: Secondary | ICD-10-CM | POA: Diagnosis not present

## 2018-01-10 DIAGNOSIS — A4150 Gram-negative sepsis, unspecified: Secondary | ICD-10-CM | POA: Diagnosis not present

## 2018-01-10 LAB — CBC
HCT: 33.2 % — ABNORMAL LOW (ref 35.0–47.0)
Hemoglobin: 11.9 g/dL — ABNORMAL LOW (ref 12.0–16.0)
MCH: 33.4 pg (ref 26.0–34.0)
MCHC: 35.8 g/dL (ref 32.0–36.0)
MCV: 93.5 fL (ref 80.0–100.0)
PLATELETS: 93 10*3/uL — AB (ref 150–440)
RBC: 3.56 MIL/uL — ABNORMAL LOW (ref 3.80–5.20)
RDW: 13.4 % (ref 11.5–14.5)
WBC: 11.6 10*3/uL — AB (ref 3.6–11.0)

## 2018-01-10 LAB — GLUCOSE, CAPILLARY: Glucose-Capillary: 95 mg/dL (ref 70–99)

## 2018-01-10 MED ORDER — AMLODIPINE BESYLATE 5 MG PO TABS
5.0000 mg | ORAL_TABLET | Freq: Every day | ORAL | Status: DC
  Administered 2018-01-10 – 2018-01-11 (×2): 5 mg via ORAL
  Filled 2018-01-10 (×2): qty 1

## 2018-01-10 MED ORDER — ALUM & MAG HYDROXIDE-SIMETH 200-200-20 MG/5ML PO SUSP: 30.0000 mL | Freq: Four times a day (QID) | ORAL | Status: DC | PRN

## 2018-01-10 MED ORDER — FAMOTIDINE IN NACL 20-0.9 MG/50ML-% IV SOLN
20.0000 mg | Freq: Two times a day (BID) | INTRAVENOUS | Status: DC
  Administered 2018-01-10 (×2): 20 mg via INTRAVENOUS
  Filled 2018-01-10 (×3): qty 50

## 2018-01-10 NOTE — Progress Notes (Signed)
[place] [facility] [place] NAME: Savannah [name]    MR#:  [id number]  DATE OF BIRTH:  [birth year]  SUBJECTIVE: Feels nauseous today and also has abdominal bloating, constipation last BM was on Wednesday.  Overall her blood pressure is better, afebrile.  CHIEF COMPLAINT:   Chief Complaint  Patient presents with  . Abdominal Pain  . Weakness    REVIEW OF SYSTEMS:   ROS CONSTITUTIONAL: No fever, does have generalized weakness.   EYES: No blurred or double vision.  EARS, NOSE, AND THROAT: No tinnitus or ear pain.  RESPIRATORY: No cough, shortness of breath, wheezing or hemoptysis.  CARDIOVASCULAR: No chest pain, orthopnea, edema.  GASTROINTESTINAL: Has nausea, abdominal bloating. GENITOURINARY: No dysuria, hematuria.  ENDOCRINE: No polyuria, nocturia,  HEMATOLOGY: No anemia, easy bruising or bleeding SKIN: No rash or lesion. MUSCULOSKELETAL: No joint pain or arthritis.   NEUROLOGIC: No tingling, numbness, weakness.  PSYCHIATRY: No anxiety or depression.   DRUG ALLERGIES:  No Known Allergies  VITALS:  Blood pressure (!) 154/64, pulse 79, temperature 98.7 F (37.1 C), temperature source Oral, resp. rate 20, height 5\' 3"  (1.6 m), weight 78.9 kg, SpO2 94 %.  PHYSICAL EXAMINATION:  GENERAL:  84 y.o.-year-old patient lying in the bed with no acute distress.  EYES: Pupils equal, round, reactive to light and accommodation. No scleral icterus. Extraocular muscles intact.  HEENT: Head atraumatic, normocephalic. Oropharynx and nasopharynx clear.  NECK:  Supple, no jugular venous distention. No thyroid enlargement, no tenderness.  LUNGS: Normal breath sounds bilaterally, no wheezing, rales,rhonchi or crepitation. No use of accessory muscles of respiration.  CARDIOVASCULAR: S1, S2 normal. No murmurs, rubs, or gallops.  ABDOMEN: Soft, nontender, nondistended. Bowel sounds present. No organomegaly or mass.  abdomen is distended. EXTREMITIES: No pedal  edema, cyanosis, or clubbing.  NEUROLOGIC: Cranial nerves II through XII are intact. Muscle strength 5/5 in all extremities. Sensation intact. Gait not checked.  PSYCHIATRIC: The patient is alert and oriented x 3.  SKIN: No obvious rash, lesion, or ulcer.    LABORATORY PANEL:   CBC Recent Labs  Lab 01/10/18 0616  WBC 11.6*  HGB 11.9*  HCT 33.2*  PLT 93*   ------------------------------------------------------------------------------------------------------------------  Chemistries  Recent Labs  Lab 01/08/18 0922 01/09/18 0636  NA 138 137  K 3.6 3.6  CL 100 103  CO2 27 26  GLUCOSE 130* 92  BUN 14 22  CREATININE 1.10* 1.10*  CALCIUM 8.7* 8.1*  AST 22  --   ALT 11  --   ALKPHOS 43  --   BILITOT 1.9*  --    ------------------------------------------------------------------------------------------------------------------  Cardiac Enzymes No results for input(s): TROPONINI in the last 168 hours. ------------------------------------------------------------------------------------------------------------------  RADIOLOGY:  Ct Abdomen Pelvis W Contrast  Result Date: 01/08/2018 CLINICAL DATA:  84 year old female with a history breast carcinoma and lung cancer EXAM: CT ABDOMEN AND PELVIS WITH CONTRAST TECHNIQUE: Multidetector CT imaging of the abdomen and pelvis was performed using the standard protocol following bolus administration of intravenous contrast. CONTRAST:  23mL OMNIPAQUE IOHEXOL 300 MG/ML  SOLN COMPARISON:  None. FINDINGS: Lower chest: Atelectasis/scarring at the lung bases. No acute finding. Hepatobiliary: There are several low-density cystic structures of the liver, including: -segment 8 measuring 17 mm on image 11 -segment 2 measuring 22 mm x 20 mm on image 13 -segment 4A measuring 21 mm, image 17, as well as a smaller more posterior cyst on image 14 Questionable biliary ductal dilatation associated with the segment 4A lesion Cholelithiasis without  inflammatory  changes associated with the gallbladder. Pancreas: Unremarkable pancreas Spleen: Unremarkable spleen Adrenals/Urinary Tract: Unremarkable adrenal glands. Patchy appearance of the right CT nephrogram, with regions of decreased attenuation/enhancement, most pronounced at the anterior cortex. No hydronephrosis. No nephrolithiasis. Vascular calcifications at the hilum of the right kidney. Inflammatory changes in the fat at the right kidney. Unremarkable appearance of the length of the right ureter. Left kidney without hydronephrosis. Regions of left cortical thinning. No nephrolithiasis. There is a more uniform appearance of the perfusion of the left kidney. Unremarkable course the left ureter. Urinary bladder partially distended. Bilateral urinary bladder diverticulum at the bladder base. Stomach/Bowel: Paraesophageal hernia. Otherwise unremarkable stomach. Small bowel without abnormal distention. No focal inflammatory changes. No transition point. Appendix is not visualized, however, no inflammatory changes are present adjacent to the cecum to indicate an appendicitis. Extensive diverticular disease. No inflammatory changes adjacent to sigmoid colon, left colon, transverse colon. Inflammatory changes adjacent to the right kidney at the interface of right kidney and the hepatic flexure, favored to be related to the kidney. Vascular/Lymphatic: Vascular calcifications of the abdominal aorta. Calcifications of the mesenteric arteries and bilateral renal arteries. No arterial occlusion identified. Greatest diameter of the abdominal aorta measures 2.4 cm. Bilateral iliac arteries and femoral arteries are patent. Reproductive: Hysterectomy. Other: None Musculoskeletal: Osteopenia. Multilevel degenerative changes of the thoracolumbar spine. Narrowing of the disc space at L2-L3 with endplate changes. Bilateral facet changes. IMPRESSION: Patchy appearance of the right CT pyelogram, suggesting pyelonephritis, less likely  ischemia. Recommend correlation with urinalysis. Inflammatory changes in the right perinephric region adjacent to hepatic flexure, are favored to be related to the kidney and not secondary to diverticular disease, however, there are extensive diverticular changes throughout the colon. Low-density and nonenhancing cystic lesions of the liver, likely benign. Questionable mild biliary ductal dilatation adjacent to the segment 4A cyst versus perfusion anomaly. Correlation with lab values may be useful if there is concern for acute biliary abnormality. If further imaging evaluation is warranted, contrast-enhanced MR liver protocol would be the test of choice. Cholelithiasis without evidence of occult cholecystitis. Aortic atherosclerosis with associated renal and mesenteric arterial disease. Aortic Atherosclerosis (ICD10-I70.0). Paraesophageal hernia. Electronically Signed   By: Corrie Mckusick D.O.   On: 01/08/2018 11:20    EKG:   Orders placed or performed during the hospital encounter of 01/08/18  . ED EKG  . ED EKG  . EKG 12-Lead  . EKG 12-Lead  . EKG 12-Lead  . EKG 12-Lead    ASSESSMENT AND PLAN:   84 year old frail female with essential hypertension who is very active comes and because of back pain, flank pain found to have pyelonephritis. 1.  Sepsis due to acute pyelonephritis urine culture showed gram-negative rods.  Sepsis improving slowly, leukocytosis is resolving.  Wait for sensitivity results of the urine.  2.  Hypotension due to sepsis: Improving.  3.  Generalized weakness: Physical therapy consulted, recommended home health physical therapy but patient wants to go to [facility] before she goes home  #.4.possible candida vaginitis;continue nystatin powder #5 constipation, abdominal bloating, continue stool softeners, add simethicone, patient does have nausea, she can use Zofran for nausea, add PPI. With daughters.   Repeat blood cultures.  Blood cultures were done in the emergency room  but never resulted wondering if sample was not sent from the emergency room  All the records are reviewed and case discussed with Care Management/Social Workerr. Management plans discussed with the patient, family and they are in agreement.  CODE STATUS:  Full code  TOTAL TIME TAKING CARE OF THIS PATIENT: 35 minutes.   POSSIBLE D/C IN 1-2DAYS, DEPENDING ON CLINICAL CONDITION.   Epifanio Lesches M.D on 01/10/2018 at 10:47 AM  Between 7am to 6pm - Pager - [phone]  After 6pm go to [email or web address] - password [email or web address] [facility]  Office  [phone]  CC: Primary care physician; Baxter Hire, MD   Note: This dictation was prepared with Dragon dictation along with smaller phrase technology. Any transcriptional errors that result from this process are unintentional.

## 2018-01-10 NOTE — Progress Notes (Signed)
MD made aware that pt abdomen is distended and she has not had a BM since 01/06/2018. No verbal orders at this time, MD to place orders.   Tyeshia Cornforth [employer]

## 2018-01-10 NOTE — Evaluation (Signed)
Physical Therapy Evaluation Patient Details Name: Savannah [name] MRN: [id number] DOB: October 30, 1922 Today's Date: 01/10/2018   History of Present Illness  Savannah [name] is a 84yo female who comes to [facility] ED on 9/27 [name] Rt flank pain chills x3D: pt admitted with pyelonephritis. PTA pt was a fully independent community dwelling adult, driving, making grocerieis, doing water aerobics several times each week.   Clinical Impression  Pt admitted with above diagnosis. Pt currently with functional limitations due to the deficits listed below (see "PT Problem List"). Upon entry, pt in bed, daughter present. The pt is awake and agreeable to participate, although reports distressing nausea (RN already aware.) The pt is alert and oriented x3, pleasant, conversational, and following simple commands consistently, mentation WNL and baseline. Pt moving well overall, no need for physical assistance, but limited mostly by nausea, 'dry heaving' prior to entry. Functional mobility assessment demonstrates increased effort/time requirements, whereas the patient performed these at a higher level of independence PTA. Pt will benefit from skilled PT intervention to increase independence and safety with basic mobility in preparation for discharge to the venue listed below.       Follow Up Recommendations Home health PT    Equipment Recommendations  None recommended by PT    Recommendations for Other Services       Precautions / Restrictions Precautions Precautions: Fall Restrictions Weight Bearing Restrictions: No      Mobility  Bed Mobility Overal bed mobility: Independent                Transfers Overall transfer level: Independent Equipment used: None                Ambulation/Gait Ambulation/Gait assistance: Supervision Gait Distance (Feet): 15 Feet Assistive device: None       General Gait Details: PT assists with IV pole management, pt moving well around obstacles in room, no need for  external stabilization, no noted LOB with turns. Additional activity deferred as patient has been nauseated since 6am today.   Stairs            Wheelchair Mobility    Modified Rankin (Stroke Patients Only)       Balance Overall balance assessment: Independent                                           Pertinent Vitals/Pain Pain Assessment: No/denies pain    Home Living Family/patient expects to be discharged to:: Private residence Living Arrangements: Alone Available Help at Discharge: Family(daughter, [place]) Type of Home: House Home Access: Stairs to enter   [employer] of Steps: 1 Home Layout: Multi-level;Laundry or work area in basement;Able to live on main level with bedroom/bathroom;Full bath on main level Home Equipment: Cane - single point;Shower seat;Walker - 2 wheels      Prior Function Level of Independence: Independent         Comments: formerly indep with ADL/IADL.      Hand Dominance        Extremity/Trunk Assessment   Upper Extremity Assessment Upper Extremity Assessment: Overall WFL for tasks assessed    Lower Extremity Assessment Lower Extremity Assessment: Overall WFL for tasks assessed       Communication      Cognition Arousal/Alertness: Awake/alert Behavior During Therapy: WFL for tasks assessed/performed Overall Cognitive Status: Within Functional Limits for tasks assessed  General Comments      Exercises     Assessment/Plan    PT Assessment Patient needs continued PT services  PT Problem List Decreased strength;Decreased activity tolerance;Decreased mobility       PT Treatment Interventions Functional mobility training;Therapeutic activities;Therapeutic exercise;Patient/family education    PT Goals (Current goals can be found in the Care Plan section)  Acute Rehab PT Goals Patient Stated Goal: regain lost strength, decrease  nausea PT Goal Formulation: With patient Time For Goal Achievement: 01/24/18 Potential to Achieve Goals: Good    Frequency Min 2X/week   Barriers to discharge Decreased caregiver support      Co-evaluation               AM-PAC PT "6 Clicks" Daily Activity  Outcome Measure Difficulty turning over in bed (including adjusting bedclothes, sheets and blankets)?: A Little Difficulty moving from lying on back to sitting on the side of the bed? : A Little Difficulty sitting down on and standing up from a chair with arms (e.g., wheelchair, bedside commode, etc,.)?: A Little Help needed moving to and from a bed to chair (including a wheelchair)?: A Little Help needed walking in hospital room?: A Little Help needed climbing 3-5 steps with a railing? : A Little 6 Click Score: 18    End of Session   Activity Tolerance: Patient tolerated treatment well;Other (comment)(limited by nausea) Patient left: in chair;with family/visitor present;with call bell/phone within reach   PT Visit Diagnosis: Muscle weakness (generalized) (M62.81);Difficulty in walking, not elsewhere classified (R26.2)    Time: 7185-5015 PT Time Calculation (min) (ACUTE ONLY): 13 min   Charges:   PT Evaluation $PT Eval Low Complexity: 1 Low          9:03 AM, 01/10/18 Savannah [name], PT, DPT Physical Therapist - [facility]  [phone] ([place])    Savannah [name] 01/10/2018, 9:01 AM

## 2018-01-10 NOTE — Clinical Social Work Note (Signed)
Clinical Social Work Assessment  Patient Details  Name: Savannah [name] MRN: [id number] Date of Birth: 11/14/1922  Date of referral:  01/10/18               Reason for consult:  Facility Placement                Permission sought to share information with:  Facility Contact Representative Permission granted to share information::  Yes, Verbal Permission Granted  Name::        Agency::  [facility]  Relationship::     Contact Information:     Housing/Transportation Living arrangements for the past 2 months:  Single Family Home Source of Information:  Patient, Medical Team, Adult Children Patient Interpreter Needed:  None Criminal Activity/Legal Involvement Pertinent to Current Situation/Hospitalization:  No - Comment as needed Significant Relationships:  Adult Children, Community Support Lives with:  Self Do you feel safe going back to the place where you live?  Yes Need for family participation in patient care:  No (Coment)  Care giving concerns:  PT recommendation for HHPT; however, family concerned about patient's ability to care for self   Social Worker assessment / plan: The CSW met with the patient and her daughters at bedside to discuss discharge planning. The CSW explained the PT recommendation and that [employer] would have to provide prior authorization for SNF, which may be denied due to the PT recommendation for HH. The family verbalized understanding and asked that the CSW make the referral to SNF with [facility] as preference. The family understood that if [employer] denies the request, the plan is to return home with home health. Their main concern is that the patient lives alone and is not near her baseline of functioning; family does not live near enough to assist. The CSW has alerted PT for a potential re-evaluation tomorrow, and the referral has been sent. CSW is following.  Employment status:  Retired Insurance information:  Managed Medicare PT Recommendations:  Home  with Home Health Information / Referral to community resources:  Skilled Nursing Facility  Patient/Family's Response to care: The patient and her family thanked the CSW.  Patient/Family's Understanding of and Emotional Response to Diagnosis, Current Treatment, and Prognosis:  The patient and her family understand the insurance barrier.  Emotional Assessment Appearance:  Appears stated age Attitude/Demeanor/Rapport:  Gracious, Self-Confident, Engaged Affect (typically observed):  Appropriate, Pleasant Orientation:  Oriented to Self, Oriented to Place, Oriented to  Time, Oriented to Situation Alcohol / Substance use:  Never Used Psych involvement (Current and /or in the community):  No (Comment)  Discharge Needs  Concerns to be addressed:  Care Coordination, Discharge Planning Concerns Readmission within the last 30 days:  No Current discharge risk:  Chronically ill, Lives alone Barriers to Discharge:  Continued Medical Work up   Karen M White, LCSW 01/10/2018, 4:20 PM  

## 2018-01-10 NOTE — NC FL2 (Signed)
[place] LEVEL OF CARE SCREENING TOOL     IDENTIFICATION  Patient Name: Savannah [name] Birthdate: Jul 11, 1922 Sex: female Admission Date (Current Location): 01/08/2018  [place] and [place] Number:  Engineering geologist and Address:  [facility], [address] [place], [place] [address]      Provider Number: [id number]  Attending Physician Name and Address:  Epifanio Lesches, MD  Relative Name and Phone Number:  Arrie Eastern (Daughter) [phone]    Current Level of Care: Hospital Recommended Level of Care: [place] Prior Approval Number:    Date Approved/Denied:   PASRR Number: [id number]  Discharge Plan: SNF    Current Diagnoses: Patient Active Problem List   Diagnosis Date Noted  . Acute pyelonephritis 01/08/2018    Orientation RESPIRATION BLADDER Height & Weight     Self, Time, Situation, Place  Normal Continent Weight: 173 lb 15.1 oz (78.9 kg) Height:  5\' 3"  (160 cm)  BEHAVIORAL SYMPTOMS/MOOD NEUROLOGICAL BOWEL NUTRITION STATUS      Continent Diet(Heart Healthy)  AMBULATORY STATUS COMMUNICATION OF NEEDS Skin   Limited Assist Verbally Normal                       Personal Care Assistance Level of Assistance  Bathing, Feeding, Dressing Bathing Assistance: Limited assistance Feeding assistance: Independent Dressing Assistance: Limited assistance     Functional Limitations Info  Sight, Hearing, Speech Sight Info: Adequate Hearing Info: Adequate Speech Info: Adequate    SPECIAL CARE FACTORS FREQUENCY  PT (By licensed PT)     PT Frequency: Up to 5X per week              Contractures Contractures Info: Not present    Additional Factors Info  Code Status, Allergies Code Status Info: Full Allergies Info: No Known Allergies           Current Medications (01/10/2018):  This is the current hospital active medication list Current Facility-Administered Medications  Medication  Dose Route Frequency Provider Last Rate Last Dose  . acetaminophen (TYLENOL) tablet 650 mg  650 mg Oral Q6H PRN Epifanio Lesches, MD   650 mg at 01/08/18 1852   Or  . acetaminophen (TYLENOL) suppository 650 mg  650 mg Rectal Q6H PRN Epifanio Lesches, MD      . alum & mag hydroxide-simeth (MAALOX/MYLANTA) 200-200-20 MG/5ML suspension 30 mL  30 mL Oral Q6H PRN Epifanio Lesches, MD      . amLODipine (NORVASC) tablet 5 mg  5 mg Oral Daily Epifanio Lesches, MD   5 mg at 01/10/18 1245  . aspirin EC tablet 81 mg  81 mg Oral Daily Epifanio Lesches, MD   81 mg at 01/10/18 0802  . bisacodyl (DULCOLAX) EC tablet 5 mg  5 mg Oral Daily PRN Epifanio Lesches, MD   5 mg at 01/10/18 1245  . cefTRIAXone (ROCEPHIN) 1 g in sodium chloride 0.9 % 100 mL IVPB  1 g Intravenous Q24H Epifanio Lesches, MD 200 mL/hr at 01/10/18 1421 1 g at 01/10/18 1421  . docusate sodium (COLACE) capsule 100 mg  100 mg Oral BID Epifanio Lesches, MD   100 mg at 01/10/18 0802  . enoxaparin (LOVENOX) injection 40 mg  40 mg Subcutaneous Q24H Epifanio Lesches, MD   40 mg at 01/09/18 2059  . famotidine (PEPCID) IVPB 20 mg premix  20 mg Intravenous Q12H Epifanio Lesches, MD   Stopped at 01/10/18 1324  . HYDROcodone-acetaminophen (NORCO/VICODIN) 5-325 MG per tablet 1-2  tablet  1-2 tablet Oral Q4H PRN Epifanio Lesches, MD      . levothyroxine (SYNTHROID, LEVOTHROID) tablet 100 mcg  100 mcg Oral QAC breakfast Epifanio Lesches, MD   100 mcg at 01/10/18 0802  . nystatin cream (MYCOSTATIN)   Topical BID Epifanio Lesches, MD      . omega-3 acid ethyl esters (LOVAZA) capsule 1 g  1 capsule Oral Daily Epifanio Lesches, MD   1 g at 01/10/18 0802  . ondansetron (ZOFRAN) tablet 4 mg  4 mg Oral Q6H PRN Epifanio Lesches, MD       Or  . ondansetron (ZOFRAN) injection 4 mg  4 mg Intravenous Q6H PRN Epifanio Lesches, MD   4 mg at 01/10/18 1245  . simvastatin (ZOCOR) tablet 20 mg  20 mg Oral QHS  Epifanio Lesches, MD   20 mg at 01/09/18 2059  . traZODone (DESYREL) tablet 25 mg  25 mg Oral QHS PRN Epifanio Lesches, MD         Discharge Medications: Please see discharge summary for a list of discharge medications.  Relevant Imaging Results:  Relevant Lab Results:   Additional Information SS# [id number]  Zettie Pho, LCSW

## 2018-01-10 NOTE — Care Management (Signed)
RNCM consult for [facility] recommendations. Patient currently lives alone and is very active. Still drives. Daughter at bedside very concerned about patient returning home alone as she lives out of town. Per PT note she only ambulated 15 feet. Patient voice decline is mobility since she has been in the hospital. CSW has also seen the patient and we will collaborate to work towards a safe transition of care plan. Will request a re-evaluation from PT in the coming days.

## 2018-01-11 DIAGNOSIS — A4150 Gram-negative sepsis, unspecified: Secondary | ICD-10-CM | POA: Diagnosis not present

## 2018-01-11 DIAGNOSIS — N12 Tubulo-interstitial nephritis, not specified as acute or chronic: Secondary | ICD-10-CM | POA: Diagnosis not present

## 2018-01-11 LAB — URINE CULTURE

## 2018-01-11 LAB — GLUCOSE, CAPILLARY: GLUCOSE-CAPILLARY: 99 mg/dL (ref 70–99)

## 2018-01-11 MED ORDER — BISACODYL 10 MG RE SUPP
10.0000 mg | Freq: Once | RECTAL | Status: AC
  Administered 2018-01-11: 10 mg via RECTAL
  Filled 2018-01-11: qty 1

## 2018-01-11 MED ORDER — FLEET ENEMA 7-19 GM/118ML RE ENEM
1.0000 | ENEMA | Freq: Once | RECTAL | Status: AC
  Administered 2018-01-11: 1 via RECTAL

## 2018-01-11 MED ORDER — AMPICILLIN 500 MG PO CAPS: 500.0000 mg | ORAL_CAPSULE | Freq: Three times a day (TID) | ORAL | 0 refills | Status: AC

## 2018-01-11 MED ORDER — FLUCONAZOLE 100 MG PO TABS
100.0000 mg | ORAL_TABLET | Freq: Every day | ORAL | Status: DC
  Administered 2018-01-11: 100 mg via ORAL
  Filled 2018-01-11: qty 1

## 2018-01-11 MED ORDER — FLUCONAZOLE 100 MG PO TABS: 100.0000 mg | ORAL_TABLET | Freq: Every day | ORAL | 0 refills | Status: AC

## 2018-01-11 MED ORDER — FAMOTIDINE 20 MG PO TABS
20.0000 mg | ORAL_TABLET | Freq: Two times a day (BID) | ORAL | Status: DC
  Administered 2018-01-11: 20 mg via ORAL
  Filled 2018-01-11: qty 1

## 2018-01-11 MED ORDER — AMPICILLIN 500 MG PO CAPS
500.0000 mg | ORAL_CAPSULE | Freq: Three times a day (TID) | ORAL | Status: DC
  Administered 2018-01-11 (×2): 500 mg via ORAL
  Filled 2018-01-11 (×3): qty 1

## 2018-01-11 NOTE — Care Management Important Message (Signed)
Copy of signed IM left with patient in room.  

## 2018-01-11 NOTE — Discharge Summary (Signed)
[place] [facility] [place] NAME: Savannah [name]    MR#:  [id number]  DATE OF BIRTH:  09-Mar-1923  DATE OF ADMISSION:  01/08/2018 ADMITTING PHYSICIAN: Epifanio Lesches, MD  DATE OF DISCHARGE: No discharge date for patient encounter.  PRIMARY CARE PHYSICIAN: Baxter Hire, MD    ADMISSION DIAGNOSIS:  Pyelonephritis [N12]  DISCHARGE DIAGNOSIS:  Active Problems:   Acute pyelonephritis   SECONDARY DIAGNOSIS:   Past Medical History:  Diagnosis Date  . Breast cancer ([place]) 1997  . Cataract   . Lung cancer [facility]) 1983    HOSPITAL COURSE:  84 year old frail female with essential hypertension who is very active comes and because of back pain, flank pain found to have pyelonephritis  *Sepsis due to acute Ecoli pyelonephritis  Resolving Treated on our sepsis protocol  *Acute Hypotension  Resolved with IVFs for re-eval  *Acute possible candida vaginitis Continue nystatin powder, diflucan for 4 day course  DISCHARGE CONDITIONS:  stable CONSULTS OBTAINED:    DRUG ALLERGIES:  No Known Allergies  DISCHARGE MEDICATIONS:   Allergies as of 01/11/2018   No Known Allergies     Medication List    TAKE these medications   amLODipine 5 MG tablet Commonly known as:  NORVASC Take 5 mg by mouth daily.   ampicillin 500 MG capsule Commonly known as:  PRINCIPEN Take 1 capsule (500 mg total) by mouth every 8 (eight) hours.   aspirin EC 81 MG tablet Take 81 mg by mouth daily.   atenolol 50 MG tablet Commonly known as:  TENORMIN TAKE ONE TABLET TWICE A DAY   Fish Oil 1000 MG Cpdr Take 1 capsule by mouth daily.   fluconazole 100 MG tablet Commonly known as:  DIFLUCAN Take 1 tablet (100 mg total) by mouth daily. Start taking on:  01/12/2018   ketoconazole 2 % cream Commonly known as:  NIZORAL Apply topically daily as needed for irritation.   levothyroxine 100 MCG tablet Commonly known as:  SYNTHROID, LEVOTHROID Take 100  mcg by mouth daily.   losartan 50 MG tablet Commonly known as:  COZAAR Take 1 tablet (50 mg total) by mouth daily.   ranitidine 150 MG tablet Commonly known as:  ZANTAC Take 150 mg by mouth 2 (two) times daily.   simvastatin 20 MG tablet Commonly known as:  ZOCOR TAKE ONE TABLET AT BEDTIME   triamcinolone ointment 0.1 % Commonly known as:  KENALOG Apply topically daily as needed.            Durable Medical Equipment  (From admission, onward)         Start     Ordered   01/11/18 1202  For home use only DME Walker rolling  Once    Comments:  youth  Question:  Patient needs a walker to treat with the following condition  Answer:  Weakness   01/11/18 1201           DISCHARGE INSTRUCTIONS:  If you experience worsening of your admission symptoms, develop shortness of breath, life threatening emergency, suicidal or homicidal thoughts you must seek medical attention immediately by calling 911 or calling your MD immediately  if symptoms less severe.  You Must read complete instructions/literature along with all the possible adverse reactions/side effects for all the Medicines you take and that have been prescribed to you. Take any new Medicines after you have completely understood and accept all the possible adverse reactions/side effects.   Please note  You were  cared for by a hospitalist during your hospital stay. If you have any questions about your discharge medications or the care you received while you were in the hospital after you are discharged, you can call the unit and asked to speak with the hospitalist on call if the hospitalist that took care of you is not available. Once you are discharged, your primary care physician will handle any further medical issues. Please note that NO REFILLS for any discharge medications will be authorized once you are discharged, as it is imperative that you return to your primary care physician (or establish a relationship with a  primary care physician if you do not have one) for your aftercare needs so that they can reassess your need for medications and monitor your lab values.    Today   CHIEF COMPLAINT:   Chief Complaint  Patient presents with  . Abdominal Pain  . Weakness    HISTORY OF PRESENT ILLNESS:  84 y.o. female with a known history of essential hypertension, who is very independent comes in because of right flank pain, chills for the past 3 days.  Patient has nausea, back pain.  Noted to have UTI, right pyelonephritis. VITAL SIGNS:  Blood pressure 130/82, pulse 73, temperature 98.8 F (37.1 C), temperature source Oral, resp. rate 18, height 5\' 3"  (1.6 m), weight 81.8 kg, SpO2 96 %.  I/O:    Intake/Output Summary (Last 24 hours) at 01/11/2018 1211 Last data filed at 01/11/2018 1100 Gross per 24 hour  Intake 734.17 ml  Output 1101 ml  Net -366.83 ml    PHYSICAL EXAMINATION:  GENERAL:  84 y.o.-year-old patient lying in the bed with no acute distress.  EYES: Pupils equal, round, reactive to light and accommodation. No scleral icterus. Extraocular muscles intact.  HEENT: Head atraumatic, normocephalic. Oropharynx and nasopharynx clear.  NECK:  Supple, no jugular venous distention. No thyroid enlargement, no tenderness.  LUNGS: Normal breath sounds bilaterally, no wheezing, rales,rhonchi or crepitation. No use of accessory muscles of respiration.  CARDIOVASCULAR: S1, S2 normal. No murmurs, rubs, or gallops.  ABDOMEN: Soft, non-tender, non-distended. Bowel sounds present. No organomegaly or mass.  EXTREMITIES: No pedal edema, cyanosis, or clubbing.  NEUROLOGIC: Cranial nerves II through XII are intact. Muscle strength 5/5 in all extremities. Sensation intact. Gait not checked.  PSYCHIATRIC: The patient is alert and oriented x 3.  SKIN: No obvious rash, lesion, or ulcer.   DATA REVIEW:   CBC Recent Labs  Lab 01/10/18 0616  WBC 11.6*  HGB 11.9*  HCT 33.2*  PLT 93*    Chemistries   Recent Labs  Lab 01/08/18 0922 01/09/18 0636  NA 138 137  K 3.6 3.6  CL 100 103  CO2 27 26  GLUCOSE 130* 92  BUN 14 22  CREATININE 1.10* 1.10*  CALCIUM 8.7* 8.1*  AST 22  --   ALT 11  --   ALKPHOS 43  --   BILITOT 1.9*  --     Cardiac Enzymes No results for input(s): TROPONINI in the last 168 hours.  Microbiology Results  Results for orders placed or performed during the hospital encounter of 01/08/18  Urine Culture     Status: Abnormal   Collection Time: 01/08/18 11:48 AM  Result Value Ref Range Status   Specimen Description   Final    URINE, CLEAN CATCH Performed at [facility], [address], [place], [place] [address]    Special Requests   Final    NONE Performed at  [address] [place], [place] [address]    Culture >=100,000 COLONIES/mL ESCHERICHIA COLI (A)  Final   Report Status 01/11/2018 FINAL  Final   Organism ID, Bacteria ESCHERICHIA COLI (A)  Final      Susceptibility   Escherichia coli - MIC*    AMPICILLIN <=2 SENSITIVE Sensitive     CEFAZOLIN <=4 SENSITIVE Sensitive     CEFTRIAXONE <=1 SENSITIVE Sensitive     CIPROFLOXACIN <=0.25 SENSITIVE Sensitive     GENTAMICIN <=1 SENSITIVE Sensitive     IMIPENEM <=0.25 SENSITIVE Sensitive     NITROFURANTOIN <=16 SENSITIVE Sensitive     TRIMETH/SULFA <=20 SENSITIVE Sensitive     AMPICILLIN/SULBACTAM <=2 SENSITIVE Sensitive     PIP/TAZO <=4 SENSITIVE Sensitive     Extended ESBL NEGATIVE Sensitive     * >=100,000 COLONIES/mL ESCHERICHIA COLI    RADIOLOGY:  Dg Abd 2 Views  Result Date: 01/10/2018 CLINICAL DATA:  Acute generalized abdominal pain. EXAM: ABDOMEN - 2 VIEW COMPARISON:  None. FINDINGS: The bowel gas pattern is normal. There is no evidence of free air. Phleboliths are noted in the pelvis. Moderate amount of stool seen throughout the colon. Atherosclerosis of abdominal aorta is noted. IMPRESSION: Moderate stool burden.  No evidence of bowel obstruction or ileus.  Aortic Atherosclerosis (ICD10-I70.0). Electronically Signed   By: Marijo Conception, M.D.   On: 01/10/2018 18:34    EKG:   Orders placed or performed during the hospital encounter of 01/08/18  . ED EKG  . ED EKG  . EKG 12-Lead  . EKG 12-Lead  . EKG 12-Lead  . EKG 12-Lead      Management plans discussed with the patient, family and they are in agreement.  CODE STATUS:     Code Status Orders  (From admission, onward)         Start     Ordered   01/08/18 1420  Full code  Continuous     01/08/18 1420        Code Status History    This patient has a current code status but no historical code status.    Advance Directive Documentation     Most Recent Value  Type of Advance Directive  Healthcare Power of Attorney, Living will  Pre-existing out of facility DNR order (yellow form or pink MOST form)  -  "MOST" Form in Place?  -      TOTAL TIME TAKING CARE OF THIS PATIENT: 40 minutes.    Avel Peace Azel Gumina M.D on 01/11/2018 at 12:11 PM  Between 7am to 6pm - Pager - [phone]  After 6pm go to [email or web address] - password [email or web address] [facility]  Office  [phone]  CC: Primary care physician; Baxter Hire, MD   Note: This dictation was prepared with Dragon dictation along with smaller phrase technology. Any transcriptional errors that result from this process are unintentional.

## 2018-01-11 NOTE — Clinical Social Work Note (Signed)
CSW spoke with patient and her daughters this morning after PT assessed. Patient did extremely well with PT and ambulated 320 feet. Patient's daughter understand that patient is doing too well for short term rehab at this time. They are unable to private pay for short term rehab either. They are in agreement for home with home health. Shela Leff MSW,LCSW [phone]

## 2018-01-11 NOTE — Progress Notes (Signed)
PHARMACIST - PHYSICIAN COMMUNICATION  DR:   Jerelyn Charles  CONCERNING: IV to Oral Route Change Policy  RECOMMENDATION: This patient is receiving famotidine by the intravenous route.  Based on criteria approved by the Pharmacy and Therapeutics Committee, the intravenous medication(s) is/are being converted to the equivalent oral dose form(s).   DESCRIPTION: These criteria include:  The patient is eating (either orally or via tube) and/or has been taking other orally administered medications for a least 24 hours  The patient has no evidence of active gastrointestinal bleeding or impaired GI absorption (gastrectomy, short bowel, patient on TNA or NPO).  If you have questions about this conversion, please contact the Pharmacy Department  []   [phone] )  Savannah [name] [x]   [phone] )  [facility] []   [phone] )  Savannah [name] []   [phone] )  Savannah [name] []   [phone] )  Savannah [name]   Vallery Sa, PharmD

## 2018-01-11 NOTE — Evaluation (Signed)
Physical Therapy Treatment Patient Details Name: Savannah [name] MRN: [id number] DOB: Feb 04, 1923 Today's Date: 01/11/2018   History of Present Illness  Savannah [name] is a 84yo female who comes [facility] ED on 9/27 c Rt flank pain chills x3D: pt admitted with pyelonephritis. PTA pt was a fully independent community dwelling adult, driving, making grocerieis, doing water aerobics several times each week.   Clinical Impression  Upon arrival pt sitting on EOB with nursing, eager and willing to work with PT. Pt with improved nausea and able to demonstrate full independence with basic bed mobility and sit to stand transfers. Pt ambulating 160 feet with no AD, decrease in gait speed, no LOB, and no physical assist needed. Pt subjectively reporting she feels unsteady, but demonstrates no visible signs of imbalance. Due to concerns for balance and safety, 80 feet of ambulation completed with RW. Pt ambulating slower with RW and indicating that it feels in her way. Pt able to ambulate an additional 80 feet with no AD and only supervision for safety. Pt is a moderate fall risk based on the Tinetti Balance and Gait Assessment, however pt completing all criteria with no AD. Pt would benefit from a RW and HHPT to improve general deconditioning and weakness noted following hospital stay.     Follow Up Recommendations Home health PT    Equipment Recommendations  Rolling walker with 5" wheels    Recommendations for Other Services       Precautions / Restrictions Precautions Precautions: Fall Restrictions Weight Bearing Restrictions: No      Mobility  Bed Mobility Overal bed mobility: Independent                Transfers Overall transfer level: Independent Equipment used: None                Ambulation/Gait Ambulation/Gait assistance: Supervision Gait Distance (Feet): 160 Feet(160x2 (80 feet with RW)) Assistive device: None       General Gait Details: Pt with no LOB during ambulation,  during head turns pt needing to slow down and reports feeling unstable, slower gait than baseline but steady, no physical assist needed  Stairs            Wheelchair Mobility    Modified Rankin (Stroke Patients Only)       Balance Overall balance assessment: Independent  Tinetti Gait and Balance Assessment: 22/28  scored 1/2 on attempts to rise, nudged, trunk, path. 0/1 on turning 360 degrees continuity of steps and walking time                                     Pertinent Vitals/Pain Pain Assessment: No/denies pain    Home Living Family/patient expects to be discharged to:: Private residence Living Arrangements: Alone Available Help at Discharge: Family(neighbors, daughter) Type of Home: House Home Access: Stairs to enter   [employer] of Steps: 1 Home Layout: Multi-level;Laundry or work area in basement;Able to live on main level with bedroom/bathroom;Full bath on main level        Prior Function Level of Independence: Independent         Comments: formerly indep with ADL/IADL.      Hand Dominance        Extremity/Trunk Assessment   Upper Extremity Assessment Upper Extremity Assessment: Generalized weakness(grossly 3+/5 for all joints, generalized weakness compared to baseline)    Lower Extremity Assessment Lower Extremity Assessment: Generalized  weakness(grossly 3+/5 for all joints, generalized weakness compared to baseline)       Communication   Communication: No difficulties  Cognition Arousal/Alertness: Awake/alert Behavior During Therapy: WFL for tasks assessed/performed Overall Cognitive Status: Within Functional Limits for tasks assessed                                        General Comments      Exercises     Assessment/Plan    PT Assessment Patient needs continued PT services  PT Problem List Decreased strength;Decreased activity tolerance;Decreased mobility;Decreased  balance;Decreased knowledge of use of DME       PT Treatment Interventions Functional mobility training;Therapeutic activities;Therapeutic exercise;Patient/family education    PT Goals (Current goals can be found in the Care Plan section)  Acute Rehab PT Goals Patient Stated Goal: regain strength that has been diminished since hospital admission PT Goal Formulation: With patient Time For Goal Achievement: 01/25/18 Potential to Achieve Goals: Good    Frequency Min 2X/week   Barriers to discharge Decreased caregiver support      Co-evaluation               AM-PAC PT "6 Clicks" Daily Activity  Outcome Measure Difficulty turning over in bed (including adjusting bedclothes, sheets and blankets)?: A Little Difficulty moving from lying on back to sitting on the side of the bed? : A Little Difficulty sitting down on and standing up from a chair with arms (e.g., wheelchair, bedside commode, etc,.)?: A Little Help needed moving to and from a bed to chair (including a wheelchair)?: None Help needed walking in hospital room?: None Help needed climbing 3-5 steps with a railing? : A Little 6 Click Score: 20    End of Session Equipment Utilized During Treatment: Gait belt Activity Tolerance: Patient tolerated treatment well Patient left: in chair;with family/visitor present;with call bell/phone within reach;with chair alarm set   PT Visit Diagnosis: Muscle weakness (generalized) (M62.81);Difficulty in walking, not elsewhere classified (R26.2)    Time: 1423-9532 PT Time Calculation (min) (ACUTE ONLY): 40 min   Charges:              Ernie Avena, SPT 01/11/2018, 11:32 AM

## 2018-01-11 NOTE — Care Management Note (Signed)
Case Management Note  Patient Details  Name: Savannah [name] MRN: [id number] Date of Birth: [birth year]   Patient admitted from home with Sepsis due to acute Ecoli pyelonephritis .  Patient lives at home alone.  Daughter lives locally for support, and provides transportation if needed.  Patient states that prior to admission she was driving herself.  PCP Edwina Barth. Patient denies issus obtaining medications.   PT has assessed patient and recommends home health PT.  Patient agreeable to home health services.  That have used [facility] in the past.  Request to use them again and would like a PT by the name of Waldon Reining.  Referral has been made to [place] with [facility].  Youth RW delivered to room prior to discharge by Corene Cornea with [facility]. Daughter provided with out pocket PCS list as requested.  RNCM signing off.   Subjective/Objective:                    Action/Plan:   Expected Discharge Date:  01/11/18               Expected Discharge Plan:  [place]  In-House Referral:     Discharge planning Services  CM Consult  Post Acute Care Choice:  Durable Medical Equipment, Home Health Choice offered to:  Patient, Adult Children  DME Arranged:  [facility] youth DME Agency:  [place] Arranged:  RN, PT, Nurse's Aide [place] Agency:  [facility] (formerly [employer])  Status of Service:  Completed, signed off  If discussed at [employer], dates discussed:    Additional Comments:  Beverly Sessions, RN 01/11/2018, 1:53 PM

## 2018-01-11 NOTE — Progress Notes (Signed)
Nsg Discharge Note  Admit Date:  01/08/2018 Discharge date: 01/11/2018   [facility] Finer [place] to be D/C'd Home per MD order.  AVS completed.  Copy for chart, and copy for patient signed, and dated. Patient/caregiver able to verbalize understanding.  Discharge Medication: Allergies as of 01/11/2018   No Known Allergies     Medication List    TAKE these medications   amLODipine 5 MG tablet Commonly known as:  NORVASC Take 5 mg by mouth daily.   ampicillin 500 MG capsule Commonly known as:  PRINCIPEN Take 1 capsule (500 mg total) by mouth every 8 (eight) hours.   aspirin EC 81 MG tablet Take 81 mg by mouth daily.   atenolol 50 MG tablet Commonly known as:  TENORMIN TAKE ONE TABLET TWICE A DAY   Fish Oil 1000 MG Cpdr Take 1 capsule by mouth daily.   fluconazole 100 MG tablet Commonly known as:  DIFLUCAN Take 1 tablet (100 mg total) by mouth daily. Start taking on:  01/12/2018   ketoconazole 2 % cream Commonly known as:  NIZORAL Apply topically daily as needed for irritation.   levothyroxine 100 MCG tablet Commonly known as:  SYNTHROID, LEVOTHROID Take 100 mcg by mouth daily.   losartan 50 MG tablet Commonly known as:  COZAAR Take 1 tablet (50 mg total) by mouth daily.   ranitidine 150 MG tablet Commonly known as:  ZANTAC Take 150 mg by mouth 2 (two) times daily.   simvastatin 20 MG tablet Commonly known as:  ZOCOR TAKE ONE TABLET AT BEDTIME   triamcinolone ointment 0.1 % Commonly known as:  KENALOG Apply topically daily as needed.            Durable Medical Equipment  (From admission, onward)         Start     Ordered   01/11/18 1202  For home use only DME Walker rolling  Once    Comments:  youth  Question:  Patient needs a walker to treat with the following condition  Answer:  Weakness   01/11/18 1201          Discharge Assessment: Vitals:   01/11/18 0436 01/11/18 1208  BP: (!) 173/74 130/82  Pulse: 72 73  Resp: 20 18  Temp: 97.8 F (36.6 C)  98.8 F (37.1 C)  SpO2: 95% 96%   Skin clean, dry and intact without evidence of skin break down, no evidence of skin tears noted. IV catheter discontinued intact. Site without signs and symptoms of complications - no redness or edema noted at insertion site, patient denies c/o pain - only slight tenderness at site.  Dressing with slight pressure applied.  D/c Instructions-Education: Discharge instructions given to patient/family with verbalized understanding. D/c education completed with patient/family including follow up instructions, medication list, d/c activities limitations if indicated, with other d/c instructions as indicated by MD - patient able to verbalize understanding, all questions fully answered. Patient instructed to return to ED, call 911, or call MD for any changes in condition.  Patient escorted via [place], and D/C home via private auto.  Eda Keys, RN 01/11/2018 2:26 PM

## 2018-01-14 ENCOUNTER — Telehealth: Payer: Self-pay

## 2018-01-14 NOTE — Telephone Encounter (Signed)
Flagged on EMMI report for not reading discharge papers.  Called and spoke with patient's daughter as patient was receiving PT at time of call.  Per daughter, patient has not read over discharge papers, however she has and they have reviewed the important parts together.  They are available to reference as needed.  No further questions or concerns at this time.  I thanked her for her time and informed her that they would receive one more automated call checking in during the next few days.

## 2018-01-15 LAB — CULTURE, BLOOD (ROUTINE X 2)
CULTURE: NO GROWTH
Culture: NO GROWTH
Special Requests: ADEQUATE

## 2019-01-18 DIAGNOSIS — H353291 Exudative age-related macular degeneration, unspecified eye, with active choroidal neovascularization: Secondary | ICD-10-CM | POA: Insufficient documentation

## 2019-07-13 ENCOUNTER — Other Ambulatory Visit: Payer: Self-pay

## 2019-07-13 ENCOUNTER — Ambulatory Visit: Payer: Medicare Other | Admitting: Dermatology

## 2019-07-13 DIAGNOSIS — D229 Melanocytic nevi, unspecified: Secondary | ICD-10-CM

## 2019-07-13 DIAGNOSIS — D1801 Hemangioma of skin and subcutaneous tissue: Secondary | ICD-10-CM | POA: Diagnosis not present

## 2019-07-13 DIAGNOSIS — L720 Epidermal cyst: Secondary | ICD-10-CM

## 2019-07-13 DIAGNOSIS — L820 Inflamed seborrheic keratosis: Secondary | ICD-10-CM

## 2019-07-13 DIAGNOSIS — L905 Scar conditions and fibrosis of skin: Secondary | ICD-10-CM

## 2019-07-13 DIAGNOSIS — B079 Viral wart, unspecified: Secondary | ICD-10-CM | POA: Diagnosis not present

## 2019-07-13 DIAGNOSIS — L578 Other skin changes due to chronic exposure to nonionizing radiation: Secondary | ICD-10-CM | POA: Diagnosis not present

## 2019-07-13 DIAGNOSIS — L814 Other melanin hyperpigmentation: Secondary | ICD-10-CM

## 2019-07-13 DIAGNOSIS — L821 Other seborrheic keratosis: Secondary | ICD-10-CM

## 2019-07-13 NOTE — Patient Instructions (Signed)

## 2019-07-13 NOTE — Progress Notes (Signed)
   New Patient Visit  Subjective  Savannah [name] is a 86 y.o. female who presents for the following: Annual Exam and Other (Spot on right lower leg. Itched at first but now it's just there.).  She has had other spots some moles and is not sure whether they have changed.  She would like a skin cancer screening.  Objective  Well appearing patient in no apparent distress; mood and affect are within normal limits.  A full examination was performed including scalp, head, eyes, ears, nose, lips, neck, chest, axillae, abdomen, back, buttocks, bilateral upper extremities, bilateral lower extremities, hands, feet, fingers, toes, fingernails, and toenails. All findings within normal limits unless otherwise noted below.  Objective  left post scalp: 0.4 cm cystic papule.  Objective  above right medial ankle: Erythematous keratotic or waxy stuck-on papule or plaque.   Objective  Left chest: Scar secondary to lobe removal for lung cancer.  Objective  Right 2nd Finger Tip: 0.5 cm verrucous papules -- Discussed viral etiology and contagion.   Assessment & Plan    Actinic Damage - diffuse scaly erythematous macules with underlying dyspigmentation - Recommend daily broad spectrum sunscreen SPF 30+ to sun-exposed areas, reapply every 2 hours as needed.  - Call for new or changing lesions. Hemangiomas - Red papules - Discussed benign nature - Observe - Call for any changes Lentigines - Scattered tan macules - Discussed due to sun exposure - Benign, observe - Call for any changes Melanocytic Nevi - Tan-brown and/or pink-flesh-colored symmetric macules and papules - Benign appearing on exam today - Observation - Call clinic for new or changing moles - Recommend daily use of broad spectrum spf 30+ sunscreen to sun-exposed areas.  Seborrheic Keratoses - Stuck-on, waxy, tan-brown papules and plaques  - Discussed benign etiology and prognosis. - Observe - Call for any changes  Epidermal  inclusion cyst left post scalp  Observe   Inflamed seborrheic keratosis above right medial ankle  If not resolved on follow up, will plan biopsy.  Destruction of lesion - above right medial ankle Complexity: simple   Destruction method: cryotherapy   Informed consent: discussed and consent obtained   Timeout:  patient name, date of birth, surgical site, and procedure verified Lesion destroyed using liquid nitrogen: Yes   Region frozen until ice ball extended beyond lesion: Yes   Outcome: patient tolerated procedure well with no complications   Post-procedure details: wound care instructions given    Scar Left chest  Viral warts, unspecified type Right 2nd Finger Tip  Destruction of lesion - Right 2nd Finger Tip Complexity: simple   Destruction method: cryotherapy   Informed consent: discussed and consent obtained   Timeout:  patient name, date of birth, surgical site, and procedure verified Lesion destroyed using liquid nitrogen: Yes   Region frozen until ice ball extended beyond lesion: Yes   Outcome: patient tolerated procedure well with no complications   Post-procedure details: wound care instructions given    Return in about 2 months (around 09/12/2019) for ISK follow up.   I, Ashok Cordia, CMA, am acting as scribe for Sarina Ser, MD .

## 2019-08-29 IMAGING — CT CT ABD-PELV W/ CM
2 of 5 series · 15 of 46 positions shown, 17 images · IV contrast (APPLIED)
Comparison: None.

CLINICAL DATA: [AGE] female with a history breast carcinoma
and lung cancer

EXAM:
CT ABDOMEN AND PELVIS WITH CONTRAST
TECHNIQUE: Multidetector CT imaging of the abdomen and pelvis was performed
using the standard protocol following bolus administration of
intravenous contrast.
CONTRAST:  75mL OMNIPAQUE IOHEXOL 300 MG/ML  SOLN

[Series 2: routine abd/pel with · axial · 0.77mm/px · z∈[-1107,-697]mm · 12 of 92 slices shown, 14 images]
[im 5/92  soft-tissue]
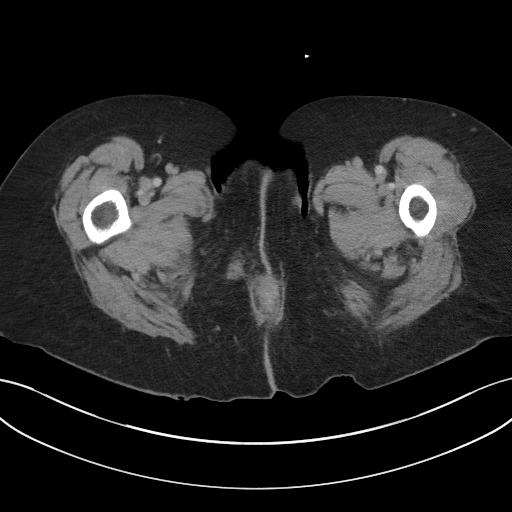
[im 5/92  bone]
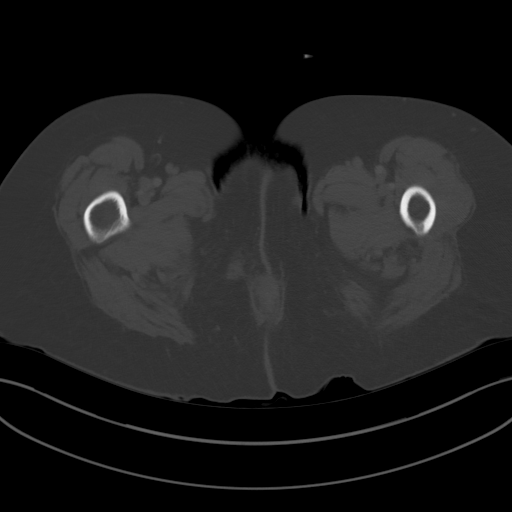
[im 15/92  soft-tissue]
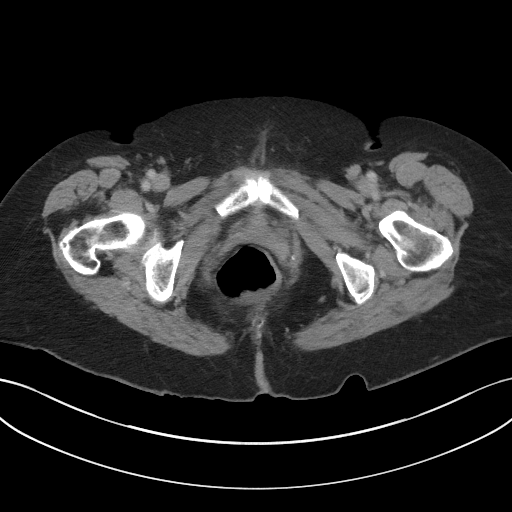
[im 20/92  soft-tissue]
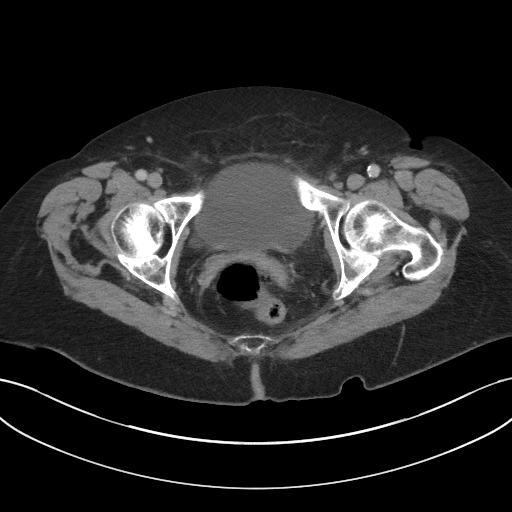
[im 29/92  soft-tissue]
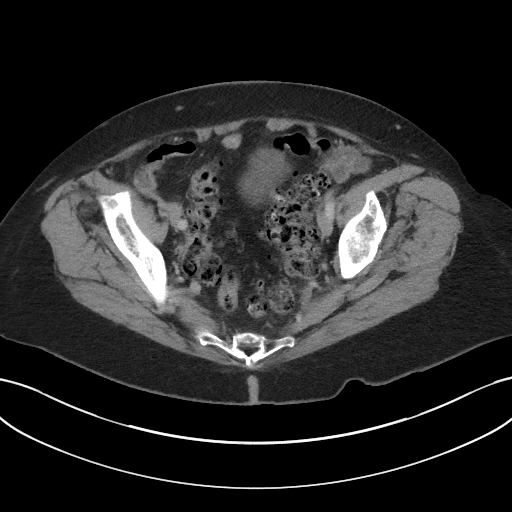
[im 34/92  soft-tissue]
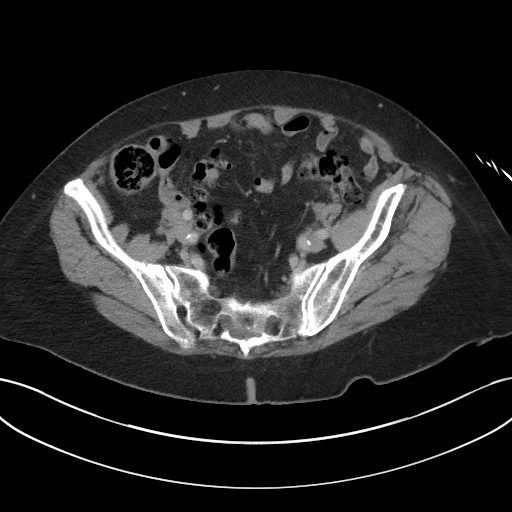
[im 44/92  soft-tissue]
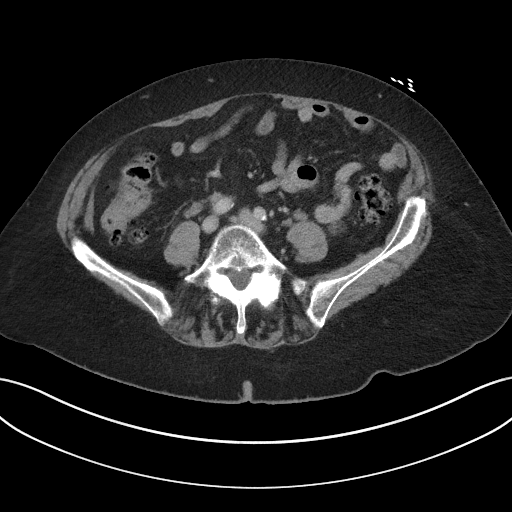
[im 48/92  soft-tissue]
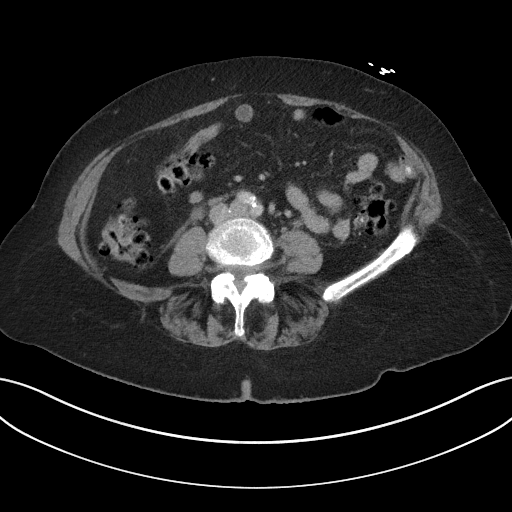
[im 58/92  soft-tissue]
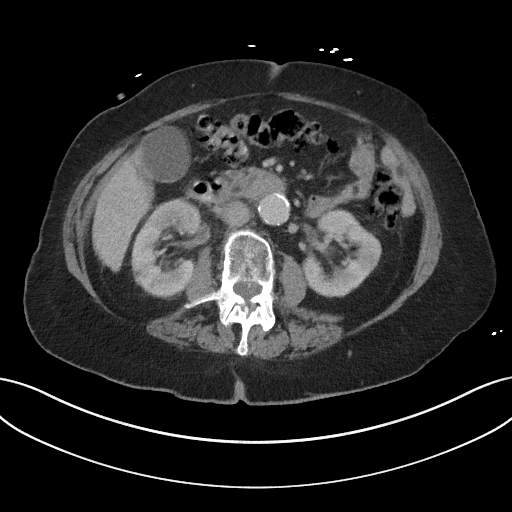
[im 63/92  soft-tissue]
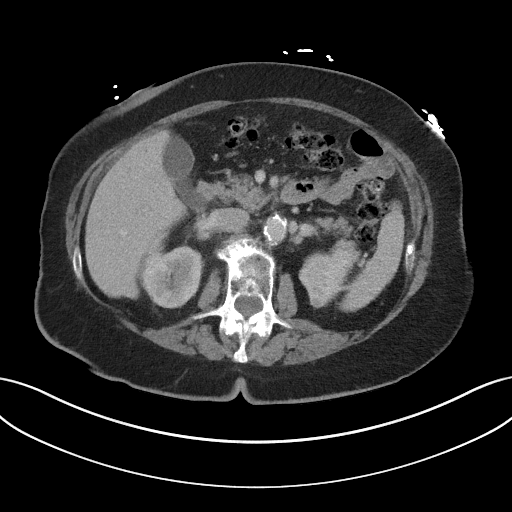
[im 63/92  bone]
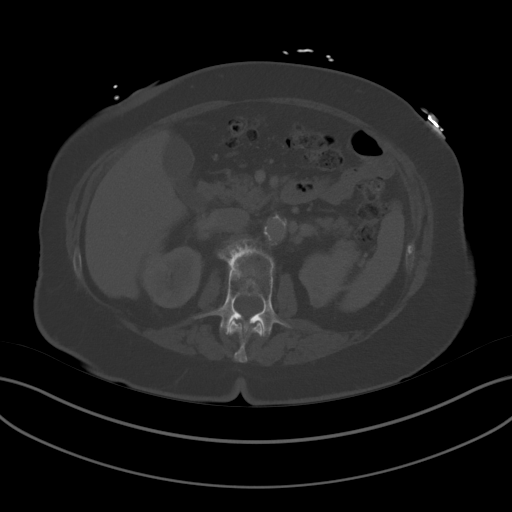
[im 72/92  soft-tissue]
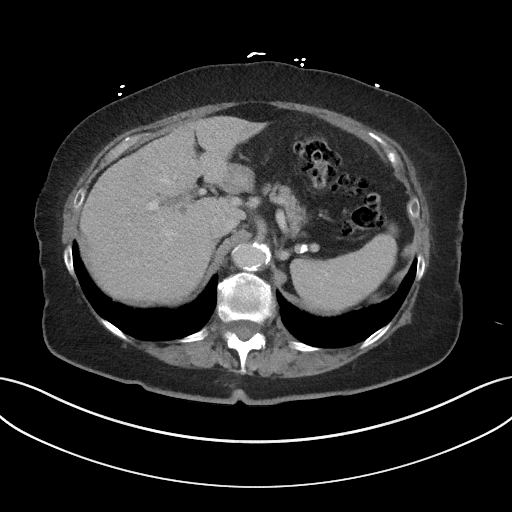
[im 77/92  soft-tissue]
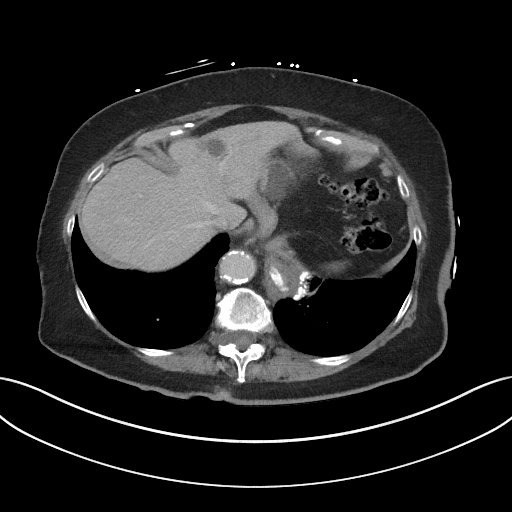
[im 87/92  soft-tissue]
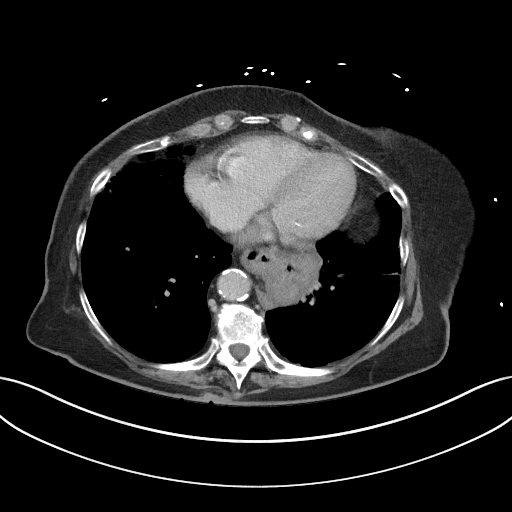

[Series 5: coronal st · coronal · 0.85mm/px · 3 of 86 slices shown]
[im 29/86  soft-tissue]
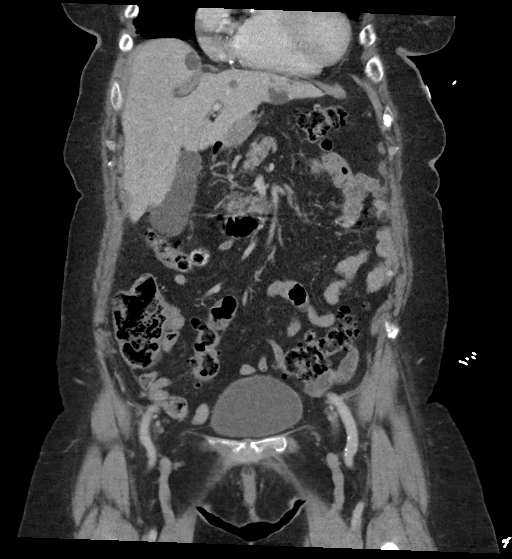
[im 38/86  soft-tissue]
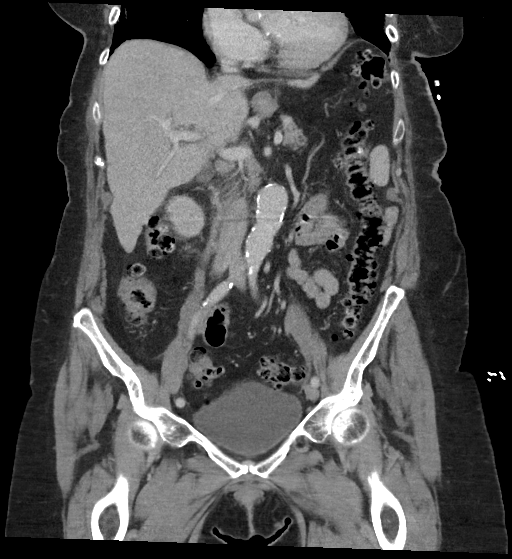
[im 48/86  soft-tissue]
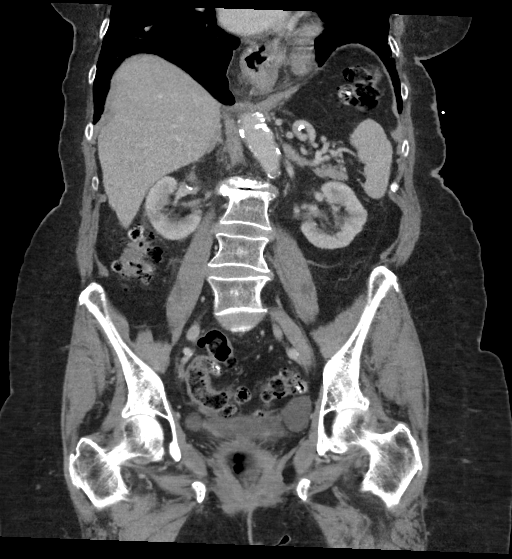

[15 of 46 positions shown; findings below may reference images not displayed]

FINDINGS: Lower chest: Atelectasis/scarring at the lung bases. No acute
finding.

Hepatobiliary: There are several low-density cystic structures of
the liver, including:

-segment 8 measuring 17 mm on image 11

-segment 2 measuring 22 mm x 20 mm on image 13

-segment 4A measuring 21 mm, image 17, as well as a smaller more
posterior cyst on image 14

Questionable biliary ductal dilatation associated with the segment
4A lesion

Cholelithiasis without inflammatory changes associated with the
gallbladder.

Pancreas: Unremarkable pancreas

Spleen: Unremarkable spleen

Adrenals/Urinary Tract: Unremarkable adrenal glands.

Patchy appearance of the right CT nephrogram, with regions of
decreased attenuation/enhancement, most pronounced at the anterior
cortex. No hydronephrosis. No nephrolithiasis. Vascular
calcifications at the hilum of the right kidney. Inflammatory
changes in the fat at the right kidney. Unremarkable appearance of
the length of the right ureter.

Left kidney without hydronephrosis. Regions of left cortical
thinning. No nephrolithiasis. There is a more uniform appearance of
the perfusion of the left kidney. Unremarkable course the left
ureter.

Urinary bladder partially distended. Bilateral urinary bladder
diverticulum at the bladder base.

Stomach/Bowel: Paraesophageal hernia. Otherwise unremarkable
stomach. Small bowel without abnormal distention. No focal
inflammatory changes. No transition point. Appendix is not
visualized, however, no inflammatory changes are present adjacent to
the cecum to indicate an appendicitis.. Extensive diverticular
disease. No inflammatory changes adjacent to sigmoid colon, left
colon, transverse colon. Inflammatory changes adjacent to the right
kidney at the interface of right kidney and the hepatic flexure,
favored to be related to the kidney.

Vascular/Lymphatic: Vascular calcifications of the abdominal aorta.
Calcifications of the mesenteric arteries and bilateral renal
arteries. No arterial occlusion identified. Greatest diameter of the
abdominal aorta measures 2.4 cm. Bilateral iliac arteries and
femoral arteries are patent.

Reproductive: Hysterectomy.

Other: None

Musculoskeletal: Osteopenia. Multilevel degenerative changes of the
thoracolumbar spine. Narrowing of the disc space at L2-L3 with
endplate changes. Bilateral facet changes.
IMPRESSION: Patchy appearance of the right CT pyelogram, suggesting
pyelonephritis, less likely ischemia. Recommend correlation with
urinalysis.

Inflammatory changes in the right perinephric region adjacent to
hepatic flexure, are favored to be related to the kidney and not
secondary to diverticular disease, however, there are extensive
diverticular changes throughout the colon.

Low-density and nonenhancing cystic lesions of the liver, likely
benign. Questionable mild biliary ductal dilatation adjacent to the
segment 4A cyst versus perfusion anomaly. Correlation with lab
values may be useful if there is concern for acute biliary
abnormality. If further imaging evaluation is warranted,
contrast-enhanced MR liver protocol would be the test of choice.

Cholelithiasis without evidence of occult cholecystitis.

Aortic atherosclerosis with associated renal and mesenteric arterial
disease. Aortic Atherosclerosis (FWMO2-B28.8).

Paraesophageal hernia.

## 2019-08-31 IMAGING — DX DG ABDOMEN 2V
2 series · 2 of 2 positions shown · non-contrast
Comparison: None.

CLINICAL DATA: Acute generalized abdominal pain.

EXAM:
ABDOMEN - 2 VIEW

[abdomen supine]
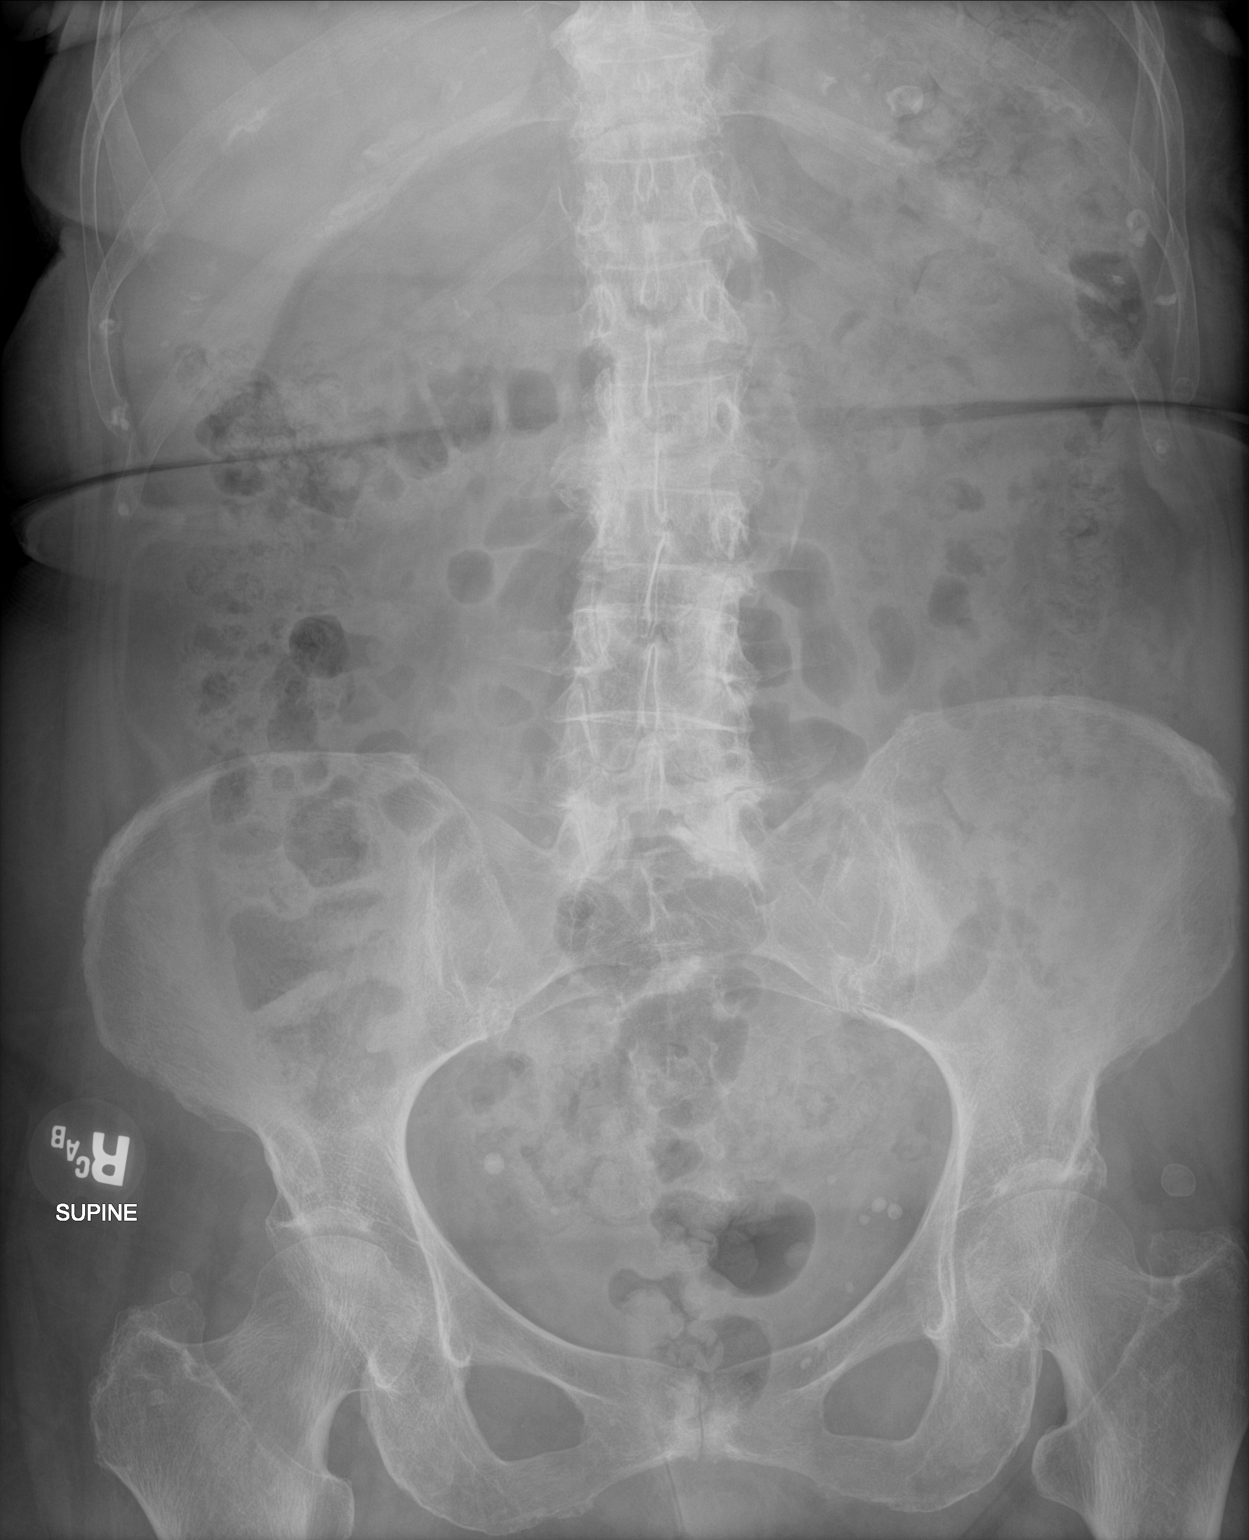

[abdomen erect]
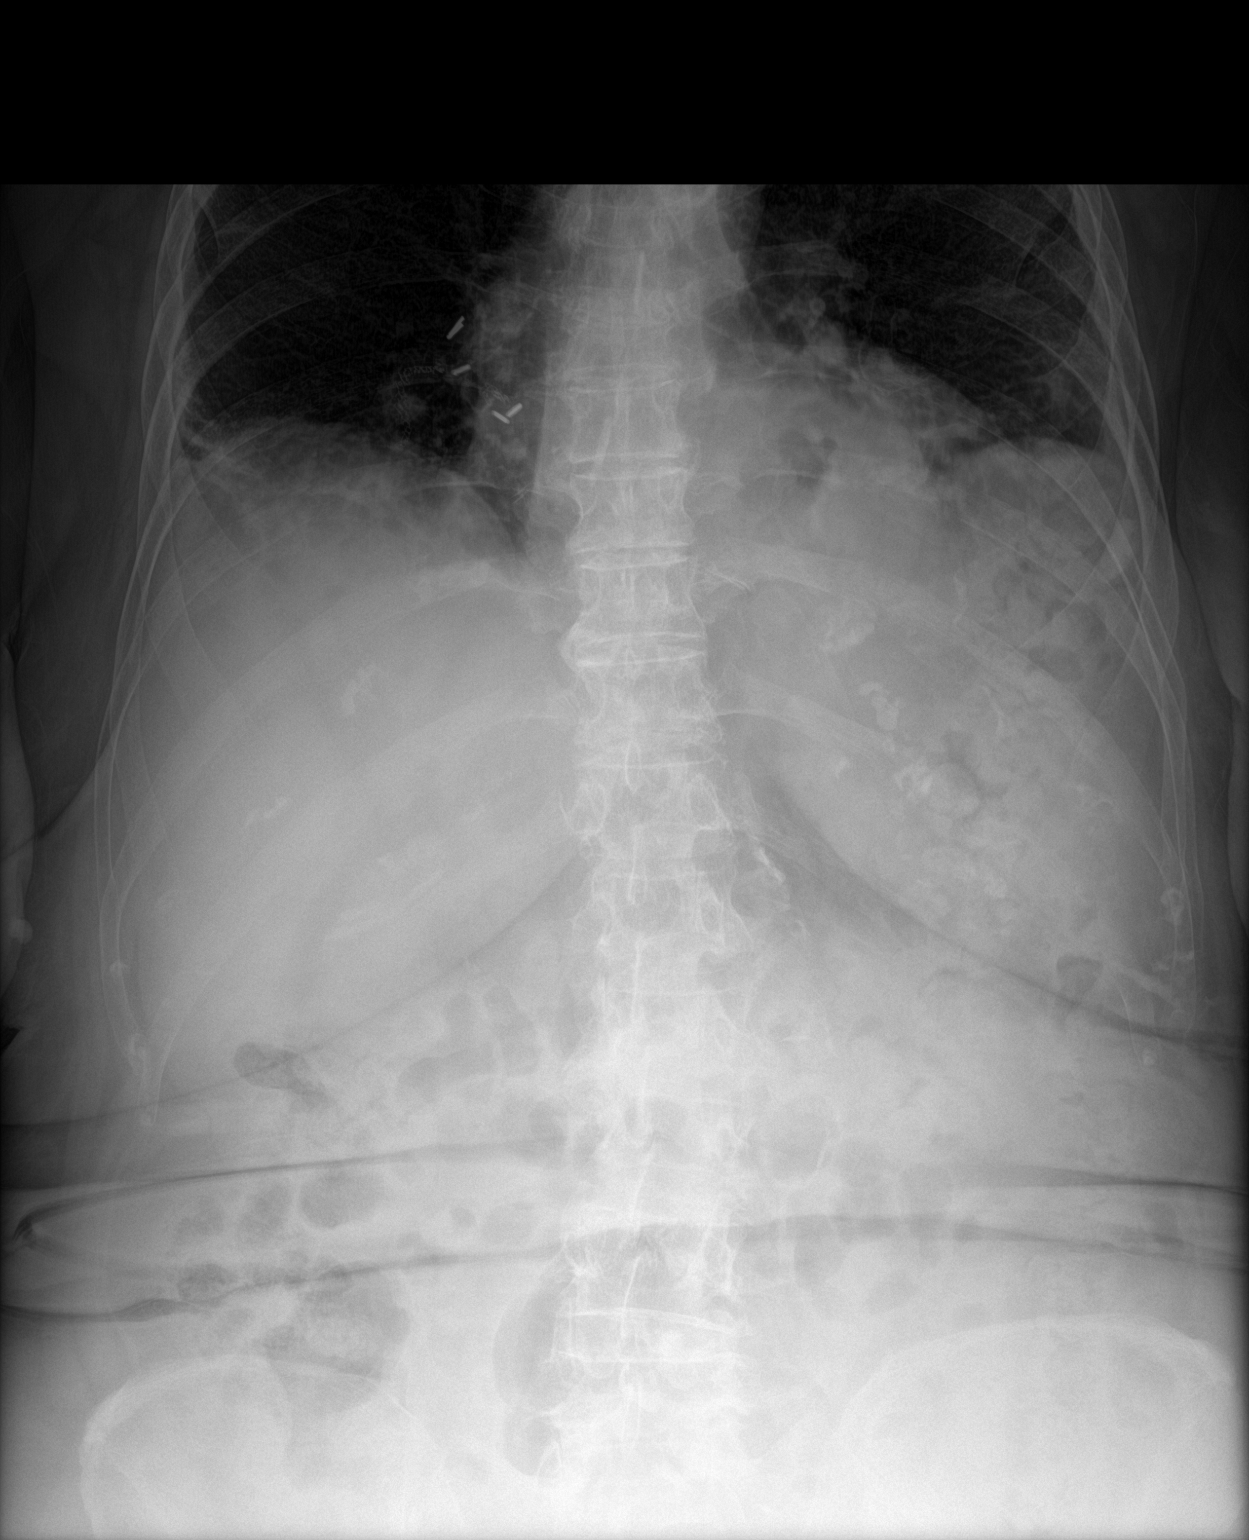

[2 of 2 positions shown; findings below may reference images not displayed]

FINDINGS: The bowel gas pattern is normal. There is no evidence of free air.
Phleboliths are noted in the pelvis. Moderate amount of stool seen
throughout the colon. Atherosclerosis of abdominal aorta is noted.
IMPRESSION: Moderate stool burden.  No evidence of bowel obstruction or ileus.

Aortic Atherosclerosis (BALPV-PY7.7).

## 2019-09-08 ENCOUNTER — Other Ambulatory Visit: Payer: Self-pay

## 2019-09-08 ENCOUNTER — Ambulatory Visit: Payer: Medicare Other | Admitting: Dermatology

## 2019-09-08 DIAGNOSIS — L578 Other skin changes due to chronic exposure to nonionizing radiation: Secondary | ICD-10-CM | POA: Diagnosis not present

## 2019-09-08 DIAGNOSIS — L820 Inflamed seborrheic keratosis: Secondary | ICD-10-CM | POA: Diagnosis not present

## 2019-09-08 DIAGNOSIS — B079 Viral wart, unspecified: Secondary | ICD-10-CM | POA: Diagnosis not present

## 2019-09-08 DIAGNOSIS — L821 Other seborrheic keratosis: Secondary | ICD-10-CM | POA: Diagnosis not present

## 2019-09-08 NOTE — Progress Notes (Signed)
   Follow-Up Visit   Subjective  Savannah [name] is a 86 y.o. female who presents for the following: recheck wart (R index finger, patient thinks she may have another one growing below it) and recheck ISK (above right medial ankle - improved and resolved per patient).  The following portions of the chart were reviewed this encounter and updated as appropriate:  Tobacco  Allergies  Meds  Problems  Med Hx  Surg Hx  Fam Hx     Review of Systems:  No other skin or systemic complaints except as noted in HPI or Assessment and Plan.  Objective  Well appearing patient in no apparent distress; mood and affect are within normal limits.  A focused examination was performed including the hands and lower legs. Relevant physical exam findings are noted in the Assessment and Plan.  Objective  B/L lower leg x 2 (2): Erythematous keratotic or waxy stuck-on papule or plaque.   Objective  R index finger x 2: Verrucous papules    Assessment & Plan  Inflamed seborrheic keratosis (2) B/L lower leg x 2  Destruction of lesion - B/L lower leg x 2 Complexity: simple   Destruction method: cryotherapy   Informed consent: discussed and consent obtained   Timeout:  patient name, date of birth, surgical site, and procedure verified Lesion destroyed using liquid nitrogen: Yes   Region frozen until ice ball extended beyond lesion: Yes   Outcome: patient tolerated procedure well with no complications   Post-procedure details: wound care instructions given    Viral warts, unspecified type R index finger x 2  Consider immunotherapy if not improved at follow up appointment  Destruction of lesion - R index finger x 2 Complexity: simple   Destruction method: cryotherapy   Informed consent: discussed and consent obtained   Timeout:  patient name, date of birth, surgical site, and procedure verified Lesion destroyed using liquid nitrogen: Yes   Region frozen until ice ball extended beyond lesion: Yes     Outcome: patient tolerated procedure well with no complications   Post-procedure details: wound care instructions given    Actinic Damage - diffuse scaly erythematous macules with underlying dyspigmentation - Recommend daily broad spectrum sunscreen SPF 30+ to sun-exposed areas, reapply every 2 hours as needed.  - Call for new or changing lesions.   Seborrheic Keratoses - Stuck-on, waxy, tan-brown papules and plaques  - Discussed benign etiology and prognosis. - Observe - Call for any changes  Return in about 3 months (around 12/09/2019) for recheck of warts.  Luther Redo, CMA, am acting as scribe for Sarina Ser, MD .  Documentation: I have reviewed the above documentation for accuracy and completeness, and I agree with the above.  Sarina Ser, MD

## 2019-09-16 ENCOUNTER — Encounter: Payer: Self-pay | Admitting: Dermatology

## 2019-12-13 ENCOUNTER — Ambulatory Visit: Payer: Medicare Other | Admitting: Dermatology

## 2020-08-23 ENCOUNTER — Ambulatory Visit: Payer: Medicare Other | Admitting: Dermatology

## 2020-08-23 ENCOUNTER — Other Ambulatory Visit: Payer: Self-pay

## 2020-08-23 DIAGNOSIS — Z1283 Encounter for screening for malignant neoplasm of skin: Secondary | ICD-10-CM | POA: Diagnosis not present

## 2020-08-23 DIAGNOSIS — L821 Other seborrheic keratosis: Secondary | ICD-10-CM

## 2020-08-23 DIAGNOSIS — B353 Tinea pedis: Secondary | ICD-10-CM | POA: Diagnosis not present

## 2020-08-23 DIAGNOSIS — L814 Other melanin hyperpigmentation: Secondary | ICD-10-CM | POA: Diagnosis not present

## 2020-08-23 DIAGNOSIS — L820 Inflamed seborrheic keratosis: Secondary | ICD-10-CM

## 2020-08-23 DIAGNOSIS — D1800 Hemangioma unspecified site: Secondary | ICD-10-CM

## 2020-08-23 DIAGNOSIS — L570 Actinic keratosis: Secondary | ICD-10-CM | POA: Diagnosis not present

## 2020-08-23 DIAGNOSIS — L578 Other skin changes due to chronic exposure to nonionizing radiation: Secondary | ICD-10-CM | POA: Diagnosis not present

## 2020-08-23 DIAGNOSIS — D229 Melanocytic nevi, unspecified: Secondary | ICD-10-CM

## 2020-08-23 DIAGNOSIS — B079 Viral wart, unspecified: Secondary | ICD-10-CM

## 2020-08-23 MED ORDER — KETOCONAZOLE 2 % EX CREA: 1.0000 "application " | TOPICAL_CREAM | Freq: Every day | CUTANEOUS | 11 refills | Status: DC

## 2020-08-23 NOTE — Progress Notes (Signed)
Follow-Up Visit   Subjective  Savannah [name] is a 87 y.o. female who presents for the following: Annual Exam (No history of skin cancer - TBSE today). The patient presents for Total-Body Skin Exam (TBSE) for skin cancer screening and mole check.  The following portions of the chart were reviewed this encounter and updated as appropriate:   Tobacco  Allergies  Meds  Problems  Med Hx  Surg Hx  Fam Hx     Review of Systems:  No other skin or systemic complaints except as noted in HPI or Assessment and Plan.  Objective  Well appearing patient in no apparent distress; mood and affect are within normal limits.  A full examination was performed including scalp, head, eyes, ears, nose, lips, neck, chest, axillae, abdomen, back, buttocks, bilateral upper extremities, bilateral lower extremities, hands, feet, fingers, toes, fingernails, and toenails. All findings within normal limits unless otherwise noted below.  Objective  Trunk/extremities x 20, forehead x 1, right preauricular x 1 (22): Erythematous keratotic or waxy stuck-on papule or plaque.   Objective  face, ears (7): Erythematous thin papules/macules with gritty scale.   Objective  Left Foot - Anterior: Scaliness  Objective  Right 2nd Finger Tip: Verrucous papules -- Discussed viral etiology and contagion.    Assessment & Plan    Lentigines - Scattered tan macules - Due to sun exposure - Benign-appering, observe - Recommend daily broad spectrum sunscreen SPF 30+ to sun-exposed areas, reapply every 2 hours as needed. - Call for any changes  Seborrheic Keratoses - Stuck-on, waxy, tan-brown papules and/or plaques  - Benign-appearing - Discussed benign etiology and prognosis. - Observe - Call for any changes  Melanocytic Nevi - Tan-brown and/or pink-flesh-colored symmetric macules and papules - Benign appearing on exam today - Observation - Call clinic for new or changing moles - Recommend daily use of broad  spectrum spf 30+ sunscreen to sun-exposed areas.   Hemangiomas - Red papules - Discussed benign nature - Observe - Call for any changes  Actinic Damage - Chronic condition, secondary to cumulative UV/sun exposure - diffuse scaly erythematous macules with underlying dyspigmentation - Recommend daily broad spectrum sunscreen SPF 30+ to sun-exposed areas, reapply every 2 hours as needed.  - Staying in the shade or wearing long sleeves, sun glasses (UVA+UVB protection) and wide brim hats (4-inch brim around the entire circumference of the hat) are also recommended for sun protection.  - Call for new or changing lesions.  Skin cancer screening performed today.  Inflamed seborrheic keratosis (22) Trunk/extremities x 20, forehead x 1, right preauricular x 1 Destruction of lesion - Trunk/extremities x 20, forehead x 1, right preauricular x 1 Complexity: simple   Destruction method: cryotherapy   Informed consent: discussed and consent obtained   Timeout:  patient name, date of birth, surgical site, and procedure verified Lesion destroyed using liquid nitrogen: Yes   Region frozen until ice ball extended beyond lesion: Yes   Outcome: patient tolerated procedure well with no complications   Post-procedure details: wound care instructions given    AK (actinic keratosis) (7) face, ears Destruction of lesion - face, ears Complexity: simple   Destruction method: cryotherapy   Informed consent: discussed and consent obtained   Timeout:  patient name, date of birth, surgical site, and procedure verified Lesion destroyed using liquid nitrogen: Yes   Region frozen until ice ball extended beyond lesion: Yes   Outcome: patient tolerated procedure well with no complications   Post-procedure details: wound care instructions  given    Tinea pedis of both feet Feet Chronic and persistent. ketoconazole (NIZORAL) 2 % cream qhs  Viral warts, unspecified type Right 2nd Finger Tip Destruction of  lesion - Right 2nd Finger Tip Complexity: simple   Destruction method: cryotherapy   Informed consent: discussed and consent obtained   Timeout:  patient name, date of birth, surgical site, and procedure verified Lesion destroyed using liquid nitrogen: Yes   Region frozen until ice ball extended beyond lesion: Yes   Outcome: patient tolerated procedure well with no complications   Post-procedure details: wound care instructions given    Skin cancer screening  Return in about 1 year (around 08/23/2021) for TBSE.  I, Ashok Cordia, CMA, am acting as scribe for Sarina Ser, MD .  Documentation: I have reviewed the above documentation for accuracy and completeness, and I agree with the above.  Sarina Ser, MD

## 2020-08-23 NOTE — Patient Instructions (Signed)

## 2020-08-28 ENCOUNTER — Encounter: Payer: Self-pay | Admitting: Dermatology

## 2020-11-28 DIAGNOSIS — D692 Other nonthrombocytopenic purpura: Secondary | ICD-10-CM | POA: Insufficient documentation

## 2021-08-29 ENCOUNTER — Encounter: Payer: Medicare Other | Admitting: Dermatology

## 2022-01-13 ENCOUNTER — Other Ambulatory Visit: Payer: Self-pay | Admitting: Dermatology

## 2022-01-13 DIAGNOSIS — B353 Tinea pedis: Secondary | ICD-10-CM

## 2022-01-23 ENCOUNTER — Ambulatory Visit: Payer: Medicare Other | Admitting: Dermatology

## 2022-01-23 ENCOUNTER — Encounter: Payer: Self-pay | Admitting: Dermatology

## 2022-01-23 DIAGNOSIS — L820 Inflamed seborrheic keratosis: Secondary | ICD-10-CM | POA: Diagnosis not present

## 2022-01-23 DIAGNOSIS — D692 Other nonthrombocytopenic purpura: Secondary | ICD-10-CM | POA: Diagnosis not present

## 2022-01-23 DIAGNOSIS — L821 Other seborrheic keratosis: Secondary | ICD-10-CM

## 2022-01-23 DIAGNOSIS — L720 Epidermal cyst: Secondary | ICD-10-CM | POA: Diagnosis not present

## 2022-01-23 DIAGNOSIS — D229 Melanocytic nevi, unspecified: Secondary | ICD-10-CM

## 2022-01-23 DIAGNOSIS — Z1283 Encounter for screening for malignant neoplasm of skin: Secondary | ICD-10-CM

## 2022-01-23 DIAGNOSIS — L578 Other skin changes due to chronic exposure to nonionizing radiation: Secondary | ICD-10-CM | POA: Diagnosis not present

## 2022-01-23 DIAGNOSIS — L570 Actinic keratosis: Secondary | ICD-10-CM | POA: Diagnosis not present

## 2022-01-23 DIAGNOSIS — L814 Other melanin hyperpigmentation: Secondary | ICD-10-CM

## 2022-01-23 NOTE — Progress Notes (Signed)
Follow-Up Visit   Subjective  Savannah [name] is a 88 y.o. female who presents for the following: Total body skin exam (Hx of AKs). The patient presents for Total-Body Skin Exam (TBSE) for skin cancer screening and mole check.  The patient has spots, moles and lesions to be evaluated, some may be new or changing and the patient has concerns that these could be cancer.   The following portions of the chart were reviewed this encounter and updated as appropriate:   Tobacco  Allergies  Meds  Problems  Med Hx  Surg Hx  Fam Hx     Review of Systems:  No other skin or systemic complaints except as noted in HPI or Assessment and Plan.  Objective  Well appearing patient in no apparent distress; mood and affect are within normal limits.  A full examination was performed including scalp, head, eyes, ears, nose, lips, neck, chest, axillae, abdomen, back, buttocks, bilateral upper extremities, bilateral lower extremities, hands, feet, fingers, toes, fingernails, and toenails. All findings within normal limits unless otherwise noted below.  face x 16 (16) Pink scaly macules  L forehead x 1, chest x 2 (3) Stuck on waxy paps with erythema  R index finger Cystic pap   Assessment & Plan   Lentigines - Scattered tan macules - Due to sun exposure - Benign-appearing, observe - Recommend daily broad spectrum sunscreen SPF 30+ to sun-exposed areas, reapply every 2 hours as needed. - Call for any changes - back  Seborrheic Keratoses - Stuck-on, waxy, tan-brown papules and/or plaques  - Benign-appearing - Discussed benign etiology and prognosis. - Observe - Call for any changes - face, arms  Melanocytic Nevi - Tan-brown and/or pink-flesh-colored symmetric macules and papules - Benign appearing on exam today - Observation - Call clinic for new or changing moles - Recommend daily use of broad spectrum spf 30+ sunscreen to sun-exposed areas.  - trunk  Hemangiomas - Red papules -  Discussed benign nature - Observe - Call for any changes - trunk  Actinic Damage - Chronic condition, secondary to cumulative UV/sun exposure - diffuse scaly erythematous macules with underlying dyspigmentation - Recommend daily broad spectrum sunscreen SPF 30+ to sun-exposed areas, reapply every 2 hours as needed.  - Staying in the shade or wearing long sleeves, sun glasses (UVA+UVB protection) and wide brim hats (4-inch brim around the entire circumference of the hat) are also recommended for sun protection.  - Call for new or changing lesions.  Skin cancer screening performed today.   AK (actinic keratosis) (16) face x 16 Destruction of lesion - face x 16 Complexity: simple   Destruction method: cryotherapy   Informed consent: discussed and consent obtained   Timeout:  patient name, date of birth, surgical site, and procedure verified Lesion destroyed using liquid nitrogen: Yes   Region frozen until ice ball extended beyond lesion: Yes   Outcome: patient tolerated procedure well with no complications   Post-procedure details: wound care instructions given    Inflamed seborrheic keratosis (3) L forehead x 1, chest x 2 Symptomatic, irritating, patient would like treated. Destruction of lesion - L forehead x 1, chest x 2 Complexity: simple   Destruction method: cryotherapy   Informed consent: discussed and consent obtained   Timeout:  patient name, date of birth, surgical site, and procedure verified Lesion destroyed using liquid nitrogen: Yes   Region frozen until ice ball extended beyond lesion: Yes   Outcome: patient tolerated procedure well with no complications  Post-procedure details: wound care instructions given    Epidermal cyst R index finger Benign-appearing. Exam most consistent with an epidermal inclusion cyst. Discussed that a cyst is a benign growth that can grow over time and sometimes get irritated or inflamed. Recommend observation if it is not bothersome.  Discussed option of surgical excision to remove it if it is growing, symptomatic, or other changes noted. Please call for new or changing lesions so they can be evaluated.  Purpura - Chronic; persistent and recurrent.  Treatable, but not curable. - Violaceous macules and patches - Benign - Related to trauma, age, sun damage and/or use of blood thinners, chronic use of topical and/or oral steroids - Observe - Can use OTC arnica containing moisturizer such as Dermend Bruise Formula if desired - Call for worsening or other concerns   Return in about 1 year (around 01/24/2023) for TBSE, Hx of AKs.  I, Othelia Pulling, RMA, am acting as scribe for Sarina Ser, MD . Documentation: I have reviewed the above documentation for accuracy and completeness, and I agree with the above.  Sarina Ser, MD

## 2022-01-23 NOTE — Patient Instructions (Addendum)
Cryotherapy Aftercare  Wash gently with soap and water everyday.   Apply Vaseline and Band-Aid daily until healed.     Due to recent changes in healthcare laws, you may see results of your pathology and/or laboratory studies on MyChart before the doctors have had a chance to review them. We understand that in some cases there may be results that are confusing or concerning to you. Please understand that not all results are received at the same time and often the doctors may need to interpret multiple results in order to provide you with the best plan of care or course of treatment. Therefore, we ask that you please give us 2 business days to thoroughly review all your results before contacting the office for clarification. Should we see a critical lab result, you will be contacted sooner.   If You Need Anything After Your Visit  If you have any questions or concerns for your doctor, please call our main line at [phone] and press option 4 to reach your doctor's medical assistant. If no one answers, please leave a voicemail as directed and we will return your call as soon as possible. Messages left after 4 pm will be answered the following business day.   You may also send us a message via MyChart. We typically respond to MyChart messages within 1-2 business days.  For prescription refills, please ask your pharmacy to contact our office. Our fax number is [phone].  If you have an urgent issue when the clinic is closed that cannot wait until the next business day, you can page your doctor at the number below.    Please note that while we do our best to be available for urgent issues outside of office hours, we are not available 24/7.   If you have an urgent issue and are unable to reach us, you may choose to seek medical care at your doctor's office, retail clinic, urgent care center, or emergency room.  If you have a medical emergency, please immediately call 911 or go to the  emergency department.  Pager Numbers  - Dr. Kowalski: [phone]  - Dr. Moye: [phone]  - Dr. Stewart: [phone]  In the event of inclement weather, please call our main line at [phone] for an update on the status of any delays or closures.  Dermatology Medication Tips: Please keep the boxes that topical medications come in in order to help keep track of the instructions about where and how to use these. Pharmacies typically print the medication instructions only on the boxes and not directly on the medication tubes.   If your medication is too expensive, please contact our office at [phone] option 4 or send us a message through MyChart.   We are unable to tell what your co-pay for medications will be in advance as this is different depending on your insurance coverage. However, we may be able to find a substitute medication at lower cost or fill out paperwork to get insurance to cover a needed medication.   If a prior authorization is required to get your medication covered by your insurance company, please allow us 1-2 business days to complete this process.  Drug prices often vary depending on where the prescription is filled and some pharmacies may offer cheaper prices.  The website www.goodrx.com contains coupons for medications through different pharmacies. The prices here do not account for what the cost may be with help from insurance (it may be cheaper with your insurance), but the website can   give you the price if you did not use any insurance.  - You can print the associated coupon and take it with your prescription to the pharmacy.  - You may also stop by our office during regular business hours and pick up a GoodRx coupon card.  - If you need your prescription sent electronically to a different pharmacy, notify our office through [facility] MyChart or by phone at [phone] option 4.     Si Usted Necesita Algo Despus de Su Visita  Tambin puede  enviarnos un mensaje a travs de MyChart. Por lo general respondemos a los mensajes de MyChart en el transcurso de 1 a 2 das hbiles.  Para renovar recetas, por favor pida a su farmacia que se ponga en contacto con nuestra oficina. Nuestro nmero de fax es el [phone].  Si tiene un asunto urgente cuando la clnica est cerrada y que no puede esperar hasta el siguiente da hbil, puede llamar/localizar a su doctor(a) al nmero que aparece a continuacin.   Por favor, tenga en cuenta que aunque hacemos todo lo posible para estar disponibles para asuntos urgentes fuera del horario de oficina, no estamos disponibles las 24 horas del da, los 7 das de la semana.   Si tiene un problema urgente y no puede comunicarse con nosotros, puede optar por buscar atencin mdica  en el consultorio de su doctor(a), en una clnica privada, en un centro de atencin urgente o en una sala de emergencias.  Si tiene una emergencia mdica, por favor llame inmediatamente al 911 o vaya a la sala de emergencias.  Nmeros de bper  - Dr. Kowalski: [phone]  - Dra. Moye: [phone]  - Dra. Stewart: [phone]  En caso de inclemencias del tiempo, por favor llame a nuestra lnea principal al [phone] para una actualizacin sobre el estado de cualquier retraso o cierre.  Consejos para la medicacin en dermatologa: Por favor, guarde las cajas en las que vienen los medicamentos de uso tpico para ayudarle a seguir las instrucciones sobre dnde y cmo usarlos. Las farmacias generalmente imprimen las instrucciones del medicamento slo en las cajas y no directamente en los tubos del medicamento.   Si su medicamento es muy caro, por favor, pngase en contacto con nuestra oficina llamando al [phone] y presione la opcin 4 o envenos un mensaje a travs de MyChart.   No podemos decirle cul ser su copago por los medicamentos por adelantado ya que esto es diferente dependiendo de la cobertura de su seguro.  Sin embargo, es posible que podamos encontrar un medicamento sustituto a menor costo o llenar un formulario para que el seguro cubra el medicamento que se considera necesario.   Si se requiere una autorizacin previa para que su compaa de seguros cubra su medicamento, por favor permtanos de 1 a 2 das hbiles para completar este proceso.  Los precios de los medicamentos varan con frecuencia dependiendo del lugar de dnde se surte la receta y alguna farmacias pueden ofrecer precios ms baratos.  El sitio web www.goodrx.com tiene cupones para medicamentos de diferentes farmacias. Los precios aqu no tienen en cuenta lo que podra costar con la ayuda del seguro (puede ser ms barato con su seguro), pero el sitio web puede darle el precio si no utiliz ningn seguro.  - Puede imprimir el cupn correspondiente y llevarlo con su receta a la farmacia.  - Tambin puede pasar por nuestra oficina durante el horario de atencin regular y recoger una tarjeta de cupones de GoodRx.  -   Si necesita que su receta se enve electrnicamente a una farmacia diferente, informe a nuestra oficina a travs de MyChart de [facility] o por telfono llamando al [phone] y presione la opcin 4.  

## 2022-02-05 ENCOUNTER — Encounter: Payer: Self-pay | Admitting: Dermatology

## 2023-01-29 ENCOUNTER — Ambulatory Visit: Payer: Medicare Other | Admitting: Dermatology

## 2023-02-12 ENCOUNTER — Encounter: Payer: Self-pay | Admitting: Dermatology

## 2023-02-12 ENCOUNTER — Ambulatory Visit: Payer: Medicare Other | Admitting: Dermatology

## 2023-02-12 DIAGNOSIS — L570 Actinic keratosis: Secondary | ICD-10-CM | POA: Diagnosis not present

## 2023-02-12 DIAGNOSIS — L814 Other melanin hyperpigmentation: Secondary | ICD-10-CM

## 2023-02-12 DIAGNOSIS — Z1283 Encounter for screening for malignant neoplasm of skin: Secondary | ICD-10-CM

## 2023-02-12 DIAGNOSIS — W908XXA Exposure to other nonionizing radiation, initial encounter: Secondary | ICD-10-CM | POA: Diagnosis not present

## 2023-02-12 DIAGNOSIS — D229 Melanocytic nevi, unspecified: Secondary | ICD-10-CM

## 2023-02-12 DIAGNOSIS — D1801 Hemangioma of skin and subcutaneous tissue: Secondary | ICD-10-CM

## 2023-02-12 DIAGNOSIS — L578 Other skin changes due to chronic exposure to nonionizing radiation: Secondary | ICD-10-CM

## 2023-02-12 DIAGNOSIS — Z872 Personal history of diseases of the skin and subcutaneous tissue: Secondary | ICD-10-CM

## 2023-02-12 DIAGNOSIS — L820 Inflamed seborrheic keratosis: Secondary | ICD-10-CM

## 2023-02-12 DIAGNOSIS — L821 Other seborrheic keratosis: Secondary | ICD-10-CM

## 2023-02-12 DIAGNOSIS — L720 Epidermal cyst: Secondary | ICD-10-CM

## 2023-02-12 NOTE — Progress Notes (Signed)
Follow-Up Visit   Subjective  Savannah [name] is a 89 y.o. female who presents for the following: Skin Cancer Screening and Full Body Skin Exam  The patient presents for Total-Body Skin Exam (TBSE) for skin cancer screening and mole check. The patient has spots, moles and lesions to be evaluated, some may be new or changing. She has a couple of new spots that she noticed last night on the right neck (picks at) and right chin. She has a history of AKs. No history of skin cancer.   The following portions of the chart were reviewed this encounter and updated as appropriate: medications, allergies, medical history  Review of Systems:  No other skin or systemic complaints except as noted in HPI or Assessment and Plan.  Objective  Well appearing patient in no apparent distress; mood and affect are within normal limits.  A full examination was performed including scalp, head, eyes, ears, nose, lips, neck, chest, axillae, abdomen, back, buttocks, bilateral upper extremities, bilateral lower extremities, hands, feet, fingers, toes, fingernails, and toenails. All findings within normal limits unless otherwise noted below.   Relevant physical exam findings are noted in the Assessment and Plan.  R neck x 1, L med thigh x 1, L upper arm x 2, R upper arm x 1 (5) Erythematous stuck-on, waxy papule or plaque  L forearm x 1, L dorsum hand x 1, L cheek x 1, forehead x 3, R malar cheek x 1,R forearm x 2, R hand dorsum x 2 (11) Erythematous thin papules/macules with gritty scale.     Assessment & Plan   SKIN CANCER SCREENING PERFORMED TODAY.  ACTINIC DAMAGE - Chronic condition, secondary to cumulative UV/sun exposure - diffuse scaly erythematous macules with underlying dyspigmentation - Recommend daily broad spectrum sunscreen SPF 30+ to sun-exposed areas, reapply every 2 hours as needed.  - Staying in the shade or wearing long sleeves, sun glasses (UVA+UVB protection) and wide brim hats (4-inch brim  around the entire circumference of the hat) are also recommended for sun protection.  - Call for new or changing lesions.  LENTIGINES, HEMANGIOMAS - Benign normal skin lesions - Benign-appearing - Call for any changes  SEBORRHEIC KERATOSIS - Stuck-on, waxy, tan-brown papules and/or plaque - Benign-appearing - Discussed benign etiology and prognosis. - Observe - Call for any changes  MELANOCYTIC NEVI - Tan-brown and/or pink-flesh-colored symmetric macules and papules - Benign appearing on exam today - Observation - Call clinic for new or changing moles - Recommend daily use of broad spectrum spf 30+ sunscreen to sun-exposed areas.   Milia - tiny firm white papules on the right chin - type of cyst - benign - sometimes these will clear with nightly OTC adapalene/Differin 0.1% gel or retinol. - may be extracted if symptomatic - observe  Inflamed seborrheic keratosis (5) R neck x 1, L med thigh x 1, L upper arm x 2, R upper arm x 1  Symptomatic, irritating, patient would like treated.  Destruction of lesion - R neck x 1, L med thigh x 1, L upper arm x 2, R upper arm x 1 (5) Complexity: simple   Destruction method: cryotherapy   Informed consent: discussed and consent obtained   Timeout:  patient name, date of birth, surgical site, and procedure verified Lesion destroyed using liquid nitrogen: Yes   Region frozen until ice ball extended beyond lesion: Yes   Cryo cycles: 1 or 2. Outcome: patient tolerated procedure well with no complications   Post-procedure details: wound  care instructions given   Additional details:  Prior to procedure, discussed risks of blister formation, small wound, skin dyspigmentation, or rare scar following cryotherapy. Recommend Vaseline ointment to treated areas while healing.   AK (actinic keratosis) (11) L forearm x 1, L dorsum hand x 1, L cheek x 1, forehead x 3, R malar cheek x 1,R forearm x 2, R hand dorsum x 2  Actinic keratoses are  precancerous spots that appear secondary to cumulative UV radiation exposure/sun exposure over time. They are chronic with expected duration over 1 year. A portion of actinic keratoses will progress to squamous cell carcinoma of the skin. It is not possible to reliably predict which spots will progress to skin cancer and so treatment is recommended to prevent development of skin cancer.  Recommend daily broad spectrum sunscreen SPF 30+ to sun-exposed areas, reapply every 2 hours as needed.  Recommend staying in the shade or wearing long sleeves, sun glasses (UVA+UVB protection) and wide brim hats (4-inch brim around the entire circumference of the hat). Call for new or changing lesions.  Destruction of lesion - L forearm x 1, L dorsum hand x 1, L cheek x 1, forehead x 3, R malar cheek x 1,R forearm x 2, R hand dorsum x 2 (11) Complexity: simple   Destruction method: cryotherapy   Informed consent: discussed and consent obtained   Timeout:  patient name, date of birth, surgical site, and procedure verified Lesion destroyed using liquid nitrogen: Yes   Region frozen until ice ball extended beyond lesion: Yes   Cryo cycles: 1 or 2. Outcome: patient tolerated procedure well with no complications   Post-procedure details: wound care instructions given   Additional details:  Prior to procedure, discussed risks of blister formation, small wound, skin dyspigmentation, or rare scar following cryotherapy. Recommend Vaseline ointment to treated areas while healing.   Multiple benign nevi  Lentigines  Actinic elastosis  Seborrheic keratoses  Cherry angioma   Return in about 1 year (around 02/12/2024) for TBSE, AKs, ISKs.  Wendee Beavers, CMA, am acting as scribe for Elie Goody, MD .   Documentation: I have reviewed the above documentation for accuracy and completeness, and I agree with the above.  Elie Goody, MD

## 2023-02-12 NOTE — Patient Instructions (Signed)

## 2023-02-16 ENCOUNTER — Telehealth: Payer: Self-pay

## 2023-02-16 NOTE — Telephone Encounter (Signed)
Patient left a nurse voicemail to please let you know she is using the Ketoconazole Cream. She has mistakenly told you last week that she was not using it.

## 2023-04-10 ENCOUNTER — Other Ambulatory Visit: Payer: Self-pay | Admitting: Dermatology

## 2023-04-10 DIAGNOSIS — B353 Tinea pedis: Secondary | ICD-10-CM

## 2024-02-18 ENCOUNTER — Ambulatory Visit: Payer: Medicare Other | Admitting: Dermatology

## 2024-02-18 DIAGNOSIS — L720 Epidermal cyst: Secondary | ICD-10-CM

## 2024-02-18 DIAGNOSIS — Z79899 Other long term (current) drug therapy: Secondary | ICD-10-CM

## 2024-02-18 DIAGNOSIS — L814 Other melanin hyperpigmentation: Secondary | ICD-10-CM

## 2024-02-18 DIAGNOSIS — L570 Actinic keratosis: Secondary | ICD-10-CM

## 2024-02-18 DIAGNOSIS — L821 Other seborrheic keratosis: Secondary | ICD-10-CM

## 2024-02-18 DIAGNOSIS — D229 Melanocytic nevi, unspecified: Secondary | ICD-10-CM

## 2024-02-18 DIAGNOSIS — L820 Inflamed seborrheic keratosis: Secondary | ICD-10-CM

## 2024-02-18 DIAGNOSIS — B353 Tinea pedis: Secondary | ICD-10-CM

## 2024-02-18 DIAGNOSIS — L578 Other skin changes due to chronic exposure to nonionizing radiation: Secondary | ICD-10-CM

## 2024-02-18 DIAGNOSIS — D1801 Hemangioma of skin and subcutaneous tissue: Secondary | ICD-10-CM

## 2024-02-18 DIAGNOSIS — W908XXA Exposure to other nonionizing radiation, initial encounter: Secondary | ICD-10-CM

## 2024-02-18 DIAGNOSIS — Z7189 Other specified counseling: Secondary | ICD-10-CM

## 2024-02-18 DIAGNOSIS — D489 Neoplasm of uncertain behavior, unspecified: Secondary | ICD-10-CM | POA: Diagnosis not present

## 2024-02-18 DIAGNOSIS — Z1283 Encounter for screening for malignant neoplasm of skin: Secondary | ICD-10-CM | POA: Diagnosis not present

## 2024-02-18 MED ORDER — KETOCONAZOLE 2 % EX CREA: TOPICAL_CREAM | CUTANEOUS | 5 refills | Status: AC

## 2024-02-18 NOTE — Patient Instructions (Addendum)

## 2024-02-18 NOTE — Progress Notes (Signed)
 Follow-Up Visit   Subjective  Savannah [name] is a 89 y.o. female who presents for the following: Skin Cancer Screening and Full Body Skin Exam; hx of AK's. Patient reports no areas of concern today.   The patient presents for Total-Body Skin Exam (TBSE) for skin cancer screening and mole check. The patient has spots, moles and lesions to be evaluated, some may be new or changing and the patient may have concern these could be cancer.  The following portions of the chart were reviewed this encounter and updated as appropriate: medications, allergies, medical history  Review of Systems:  No other skin or systemic complaints except as noted in HPI or Assessment and Plan.  Objective  Well appearing patient in no apparent distress; mood and affect are within normal limits.  A full examination was performed including scalp, head, eyes, ears, nose, lips, neck, chest, axillae, abdomen, back, buttocks, bilateral upper extremities, bilateral lower extremities, hands, feet, fingers, toes, fingernails, and toenails. All findings within normal limits unless otherwise noted below.   Relevant physical exam findings are noted in the Assessment and Plan.  Right Chin 0.6cm flesh colored papule   Right Ear, Right forehead, Left Cheek (3) Pink scaly macules Right Medial Cheek, Right lateral Nose, Left Zygoma, Right Lateral Thigh, Right Chest (7) Stuck on waxy paps with erythema  Assessment & Plan   SKIN CANCER SCREENING PERFORMED TODAY.  ACTINIC DAMAGE - Chronic condition, secondary to cumulative UV/sun exposure - diffuse scaly erythematous macules with underlying dyspigmentation - Recommend daily broad spectrum sunscreen SPF 30+ to sun-exposed areas, reapply every 2 hours as needed.  - Staying in the shade or wearing long sleeves, sun glasses (UVA+UVB protection) and wide brim hats (4-inch brim around the entire circumference of the hat) are also recommended for sun protection.  - Call for new or  changing lesions.  LENTIGINES, SEBORRHEIC KERATOSES, HEMANGIOMAS - Benign normal skin lesions - Benign-appearing - Call for any changes  MELANOCYTIC NEVI - Tan-brown and/or pink-flesh-colored symmetric macules and papules - Benign appearing on exam today - Observation - Call clinic for new or changing moles - Recommend daily use of broad spectrum spf 30+ sunscreen to sun-exposed areas.   Milia - tiny firm white papules - type of cyst - benign - sometimes these will clear with nightly OTC adapalene/Differin 0.1% gel or retinol. - may be extracted if symptomatic - observe  TINEA PEDIS Exam: Scaling and maceration web spaces and over distal and lateral soles. Chronic and persistent condition with duration or expected duration over one year. Condition is symptomatic / bothersome to patient. Not to goal. Treatment Plan: - Start Ketoconazole  2% cream apply to bilateral feet at bedtime.   NEOPLASM OF UNCERTAIN BEHAVIOR Right Chin Epidermal / dermal shaving  Lesion diameter (cm):  0.6 Informed consent: discussed and consent obtained   Timeout: patient name, date of birth, surgical site, and procedure verified   Procedure prep:  Patient was prepped and draped in usual sterile fashion Prep type:  Isopropyl alcohol Anesthesia: the lesion was anesthetized in a standard fashion   Anesthetic:  1% lidocaine w/ epinephrine 1-100,000 buffered w/ 8.4% NaHCO3 Instrument used: flexible razor blade   Hemostasis achieved with: pressure, aluminum chloride and electrodesiccation   Outcome: patient tolerated procedure well   Post-procedure details: sterile dressing applied and wound care instructions given   Dressing type: bandage (Mupirocin 2% ointment)    Specimen 1 - Surgical pathology Differential Diagnosis: Cyst vs nevus r/o dysplasia  Check Margins: Yes  ACTINIC KERATOSIS (3) Right Ear, Right forehead, Left Cheek (3) Actinic keratoses are precancerous spots that appear secondary to  cumulative UV radiation exposure/sun exposure over time. They are chronic with expected duration over 1 year. A portion of actinic keratoses will progress to squamous cell carcinoma of the skin. It is not possible to reliably predict which spots will progress to skin cancer and so treatment is recommended to prevent development of skin cancer.  Recommend daily broad spectrum sunscreen SPF 30+ to sun-exposed areas, reapply every 2 hours as needed.  Recommend staying in the shade or wearing long sleeves, sun glasses (UVA+UVB protection) and wide brim hats (4-inch brim around the entire circumference of the hat). Call for new or changing lesions. Destruction of lesion - Right Ear, Right forehead, Left Cheek (3) Complexity: simple   Destruction method: cryotherapy   Informed consent: discussed and consent obtained   Timeout:  patient name, date of birth, surgical site, and procedure verified Lesion destroyed using liquid nitrogen: Yes   Region frozen until ice ball extended beyond lesion: Yes   Outcome: patient tolerated procedure well with no complications   Post-procedure details: wound care instructions given    INFLAMED SEBORRHEIC KERATOSIS (7) Right Medial Cheek, Right lateral Nose, Left Zygoma, Right Lateral Thigh, Right Chest (7) Symptomatic, irritating, patient would like treated. Destruction of lesion - Right Medial Cheek, Right lateral Nose, Left Zygoma, Right Lateral Thigh, Right Chest (7) Complexity: simple   Destruction method: cryotherapy   Informed consent: discussed and consent obtained   Timeout:  patient name, date of birth, surgical site, and procedure verified Lesion destroyed using liquid nitrogen: Yes   Region frozen until ice ball extended beyond lesion: Yes   Outcome: patient tolerated procedure well with no complications   Post-procedure details: wound care instructions given    TINEA PEDIS OF BOTH FEET   Related Medications ketoconazole  (NIZORAL ) 2 % cream APPLY  1 APPLICATION TOPICALLY TO FEET ATBEDTIME COUNSELING AND COORDINATION OF CARE   MEDICATION MANAGEMENT   SKIN CANCER SCREENING   LENTIGO   MELANOCYTIC NEVUS, UNSPECIFIED LOCATION   ACTINIC SKIN DAMAGE   Return in about 1 year (around 02/17/2025) for TBSE.  I, Emerick Ege, CMA am acting as scribe for Alm Rhyme, MD.  Documentation: I have reviewed the above documentation for accuracy and completeness, and I agree with the above.  Alm Rhyme, MD

## 2024-02-22 ENCOUNTER — Ambulatory Visit: Payer: Self-pay | Admitting: Dermatology

## 2024-02-22 LAB — SURGICAL PATHOLOGY

## 2024-02-23 ENCOUNTER — Encounter: Payer: Self-pay | Admitting: Dermatology

## 2024-02-23 NOTE — Telephone Encounter (Addendum)
 Tried calling patient regarding results. NO answer. Lm for patient to return call. ----- Message from Alm Rhyme sent at 02/22/2024  5:56 PM EST ----- FINAL DIAGNOSIS        1. Skin, right chin :       EPIDERMOID CYST, FRAGMENTED   Benign Cyst No further treatment needed ----- Message ----- From: Interface, Lab In Three [facility] One Sent: 02/22/2024   5:05 PM EST To: Alm JAYSON Rhyme, MD

## 2024-02-24 NOTE — Telephone Encounter (Signed)
 Patient advised of BX results. aw

## 2025-02-23 ENCOUNTER — Ambulatory Visit: Admitting: Dermatology
